# Patient Record
Sex: Female | Born: 1970 | Race: Black or African American | Hispanic: No | Marital: Married | Smoking: Never smoker
Health system: Southern US, Community
[De-identification: ages and names within clinical notes are randomized; demographics above are authoritative.]

## PROBLEM LIST (undated history)

## (undated) DIAGNOSIS — Z789 Other specified health status: Secondary | ICD-10-CM

## (undated) HISTORY — PX: NO PAST SURGERIES: SHX2092

---

## 2008-04-09 ENCOUNTER — Emergency Department: Payer: Self-pay | Admitting: Unknown Physician Specialty

## 2008-05-09 ENCOUNTER — Observation Stay: Payer: Self-pay | Admitting: Obstetrics and Gynecology

## 2008-09-19 ENCOUNTER — Ambulatory Visit: Payer: Self-pay | Admitting: Internal Medicine

## 2009-07-17 ENCOUNTER — Ambulatory Visit: Payer: Self-pay | Admitting: Internal Medicine

## 2009-11-28 ENCOUNTER — Ambulatory Visit: Payer: Self-pay

## 2011-08-25 ENCOUNTER — Encounter: Payer: Self-pay | Admitting: Obstetrics and Gynecology

## 2011-09-25 ENCOUNTER — Inpatient Hospital Stay: Payer: Self-pay

## 2011-09-25 LAB — CBC WITH DIFFERENTIAL/PLATELET
Eosinophil #: 0 10*3/uL (ref 0.0–0.7)
Eosinophil %: 0.4 %
Lymphocyte %: 19 %
MCHC: 34.3 g/dL (ref 32.0–36.0)
MCV: 82 fL (ref 80–100)
Monocyte %: 5.5 %
Neutrophil %: 74.2 %
Platelet: 215 10*3/uL (ref 150–440)
RBC: 3.64 10*6/uL — ABNORMAL LOW (ref 3.80–5.20)
RDW: 19.5 % — ABNORMAL HIGH (ref 11.5–14.5)
WBC: 11.5 10*3/uL — ABNORMAL HIGH (ref 3.6–11.0)

## 2011-11-18 ENCOUNTER — Ambulatory Visit: Payer: Self-pay | Admitting: Obstetrics and Gynecology

## 2011-11-18 LAB — BASIC METABOLIC PANEL
Calcium, Total: 9.1 mg/dL (ref 8.5–10.1)
EGFR (African American): >60
EGFR (Non-African Amer.): >60
Glucose: 87 mg/dL (ref 65–99)
Potassium: 3.9 mmol/L (ref 3.5–5.1)
Sodium: 141 mmol/L (ref 136–145)

## 2011-11-18 LAB — HEMOGLOBIN: HGB: 10.7 g/dL — ABNORMAL LOW (ref 12.0–16.0)

## 2011-11-24 ENCOUNTER — Ambulatory Visit: Payer: Self-pay | Admitting: Obstetrics and Gynecology

## 2012-09-01 ENCOUNTER — Ambulatory Visit: Payer: Self-pay | Admitting: Internal Medicine

## 2014-04-10 IMAGING — US US OB < 14 WKS - NRPT
1 series · 14 of 15 positions shown · non-contrast
Comparison: none

[Series 1: us ob < 14 wks - nrpt · 15 acquisitions, 14 frames shown]
[im 1/15]
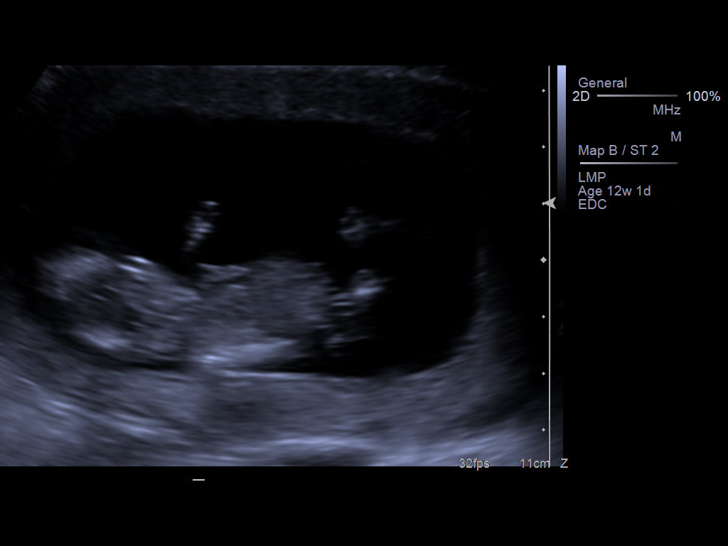
[im 2/15]
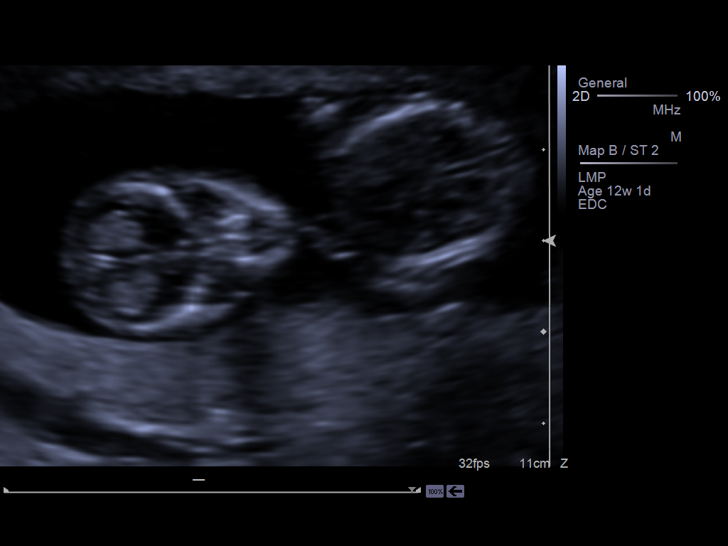
[im 3/15]
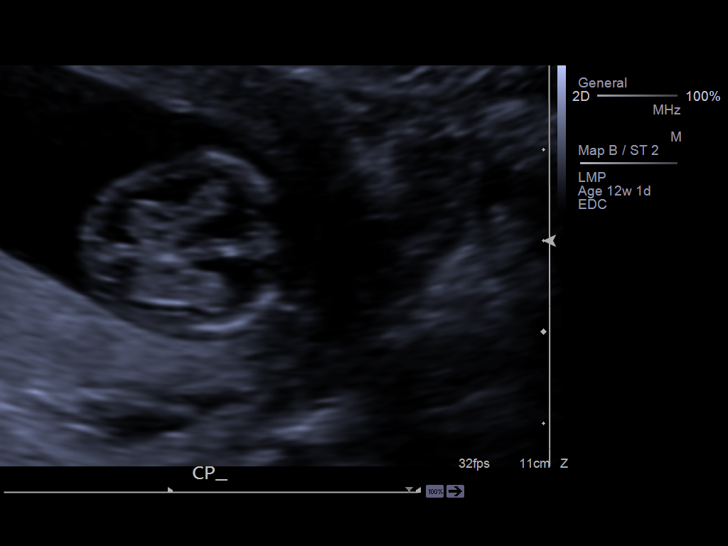
[im 4/15]
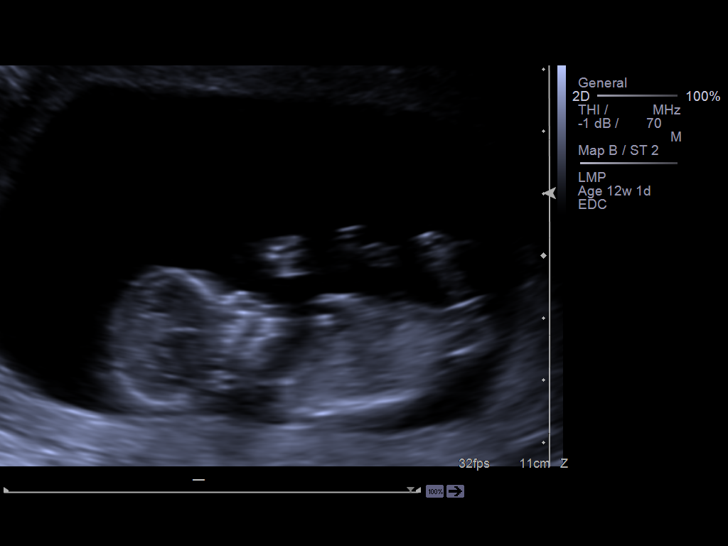
[im 5/15]
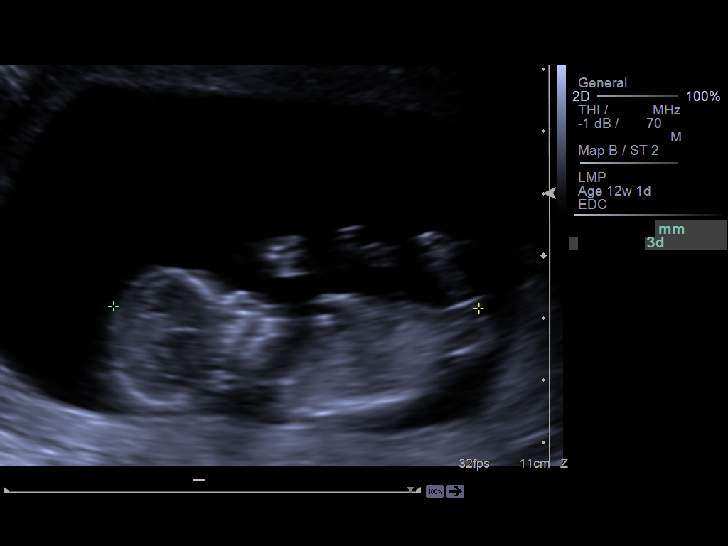
[im 6/15]
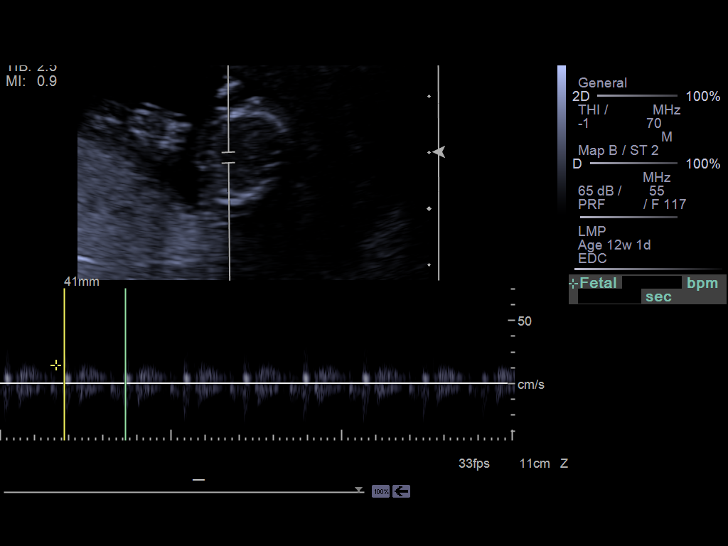
[im 7/15]
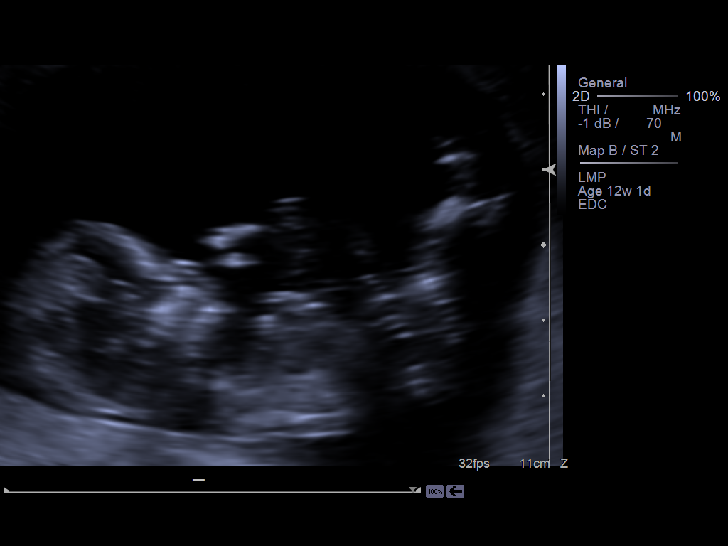
[im 9/15]
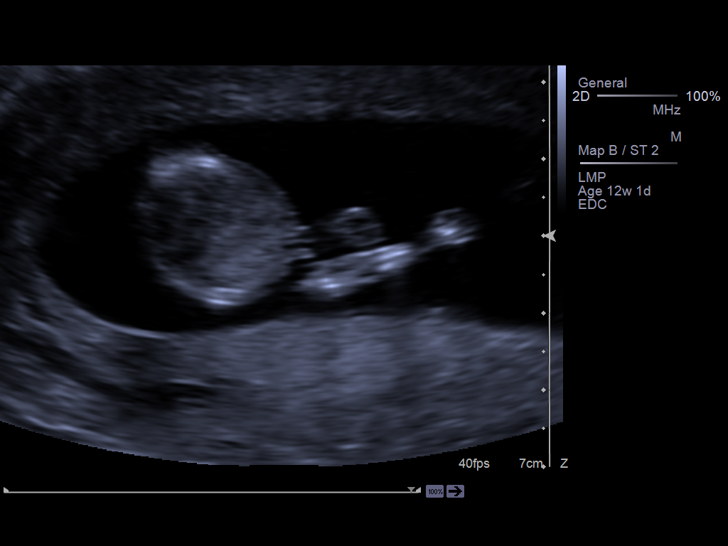
[im 10/15]
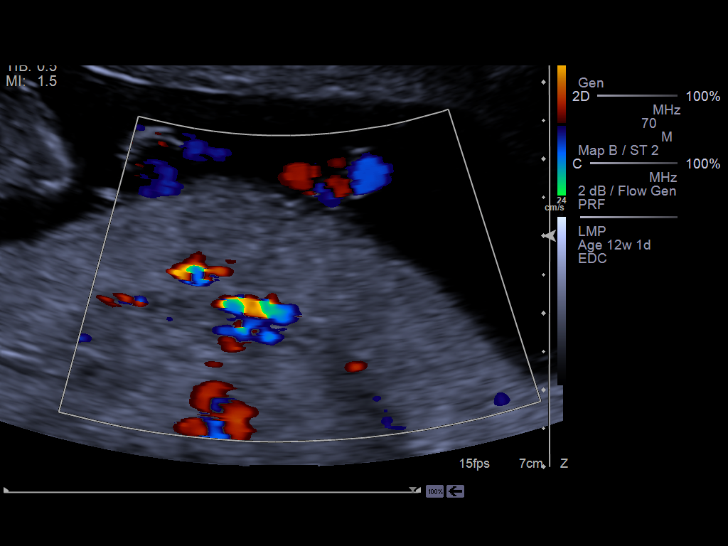
[im 11/15]
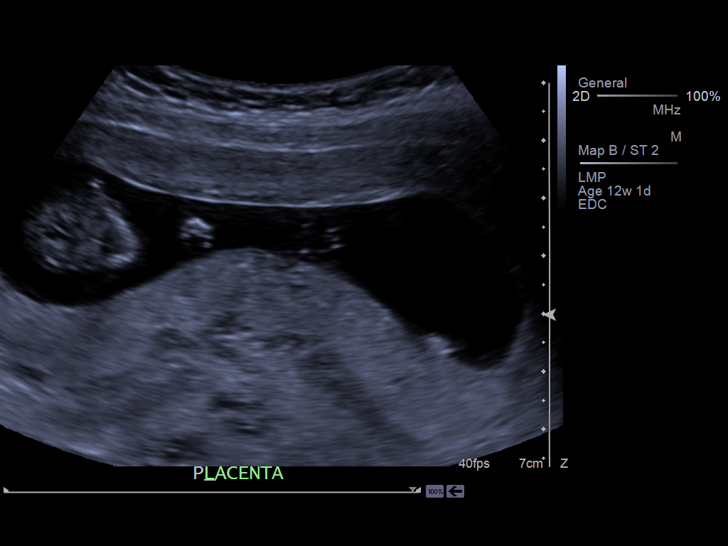
[im 12/15]
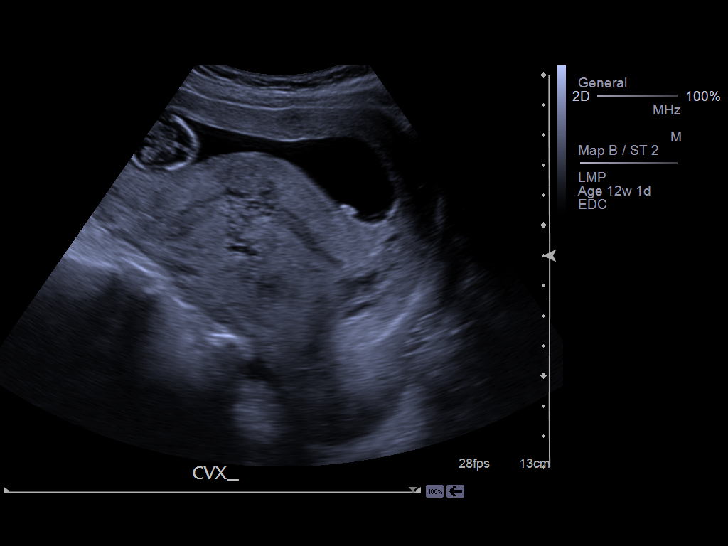
[im 13/15]
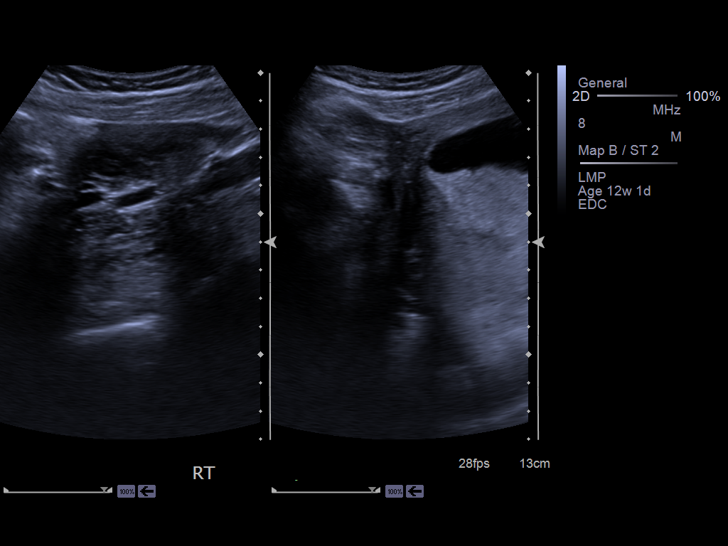
[im 14/15]
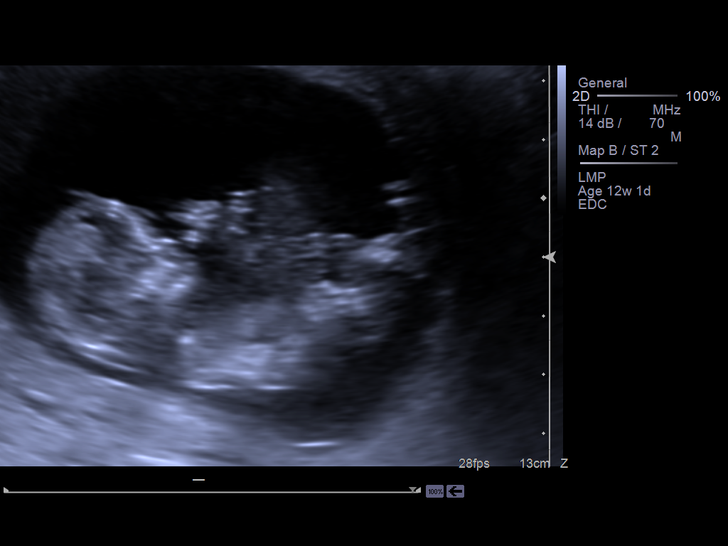
[im 15/15]
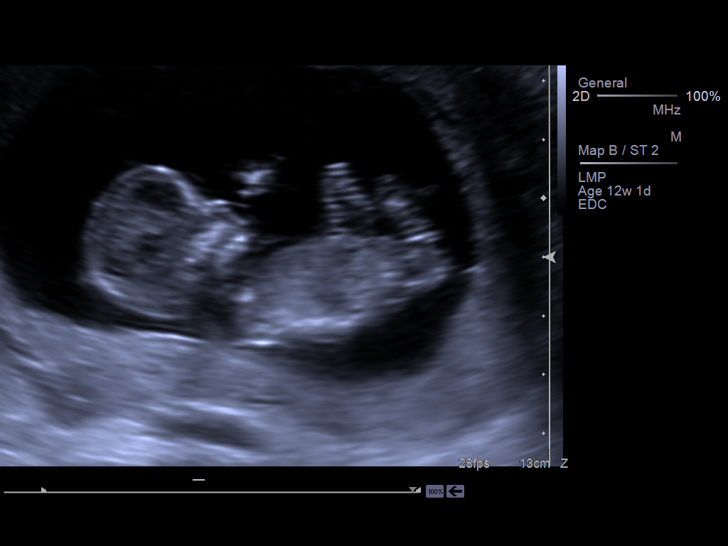

[14 of 15 positions shown; findings below may reference images not displayed]

IMAGES IMPORTED FROM THE [employer] WORKFLOW SYSTEM
NO DICTATION FOR STUDY

## 2014-05-09 NOTE — Discharge Summary (Signed)
PATIENT NAME:  LAKOTA, SCHWEPPE MR#:  [id number] DATE OF BIRTH:  [birth year]  DATE OF ADMISSION:  09/25/2011 DATE OF DISCHARGE:  09/26/2011  HOSPITAL COURSE: This is a 44 year old gravida 3, para 1 patient with missed abortion at 86 weeks. Ultrasound evidence shows fetus expired at 13 weeks. The patient came in for Cytotec induction. The patient after the second 200-mcg Cytotec vaginal medication spontaneously delivered grossly morphologically normal fetus. Placenta delivered shortly thereafter.   On postoperative day #1 the patient was doing well. Vital signs stable. Hematocrit 24.9%. She is discharged to home in good condition. The patient will be discharged to home on prenatal vitamins and iron and it was recommended that she abstain from intercourse for 30 days. We will talk about contraception thereafter. Chromosome studies performed on the abortus.    ____________________________ Boykin Nearing, MD tjs:bjt D: 09/26/2011 09:31:29 ET T: 09/29/2011 11:02:35 ET JOB#: [id number]  cc: Boykin Nearing, MD, <Dictator> Boykin Nearing MD ELECTRONICALLY SIGNED 09/30/2011 19:36

## 2014-05-09 NOTE — Op Note (Signed)
PATIENT NAME:  Linda Frazier, ROUND MR#:  [id number] DATE OF BIRTH:  Oct 15, 1970  DATE OF PROCEDURE:  11/24/2011  PREOPERATIVE DIAGNOSIS:  1. Endometrial polyp. 2. Submucosal fibroid.   POSTOPERATIVE DIAGNOSIS: Endometrial polyp.   PROCEDURES:  1. Fractional dilation and curettage.  2. Hysteroscopy.   SURGEON: Boykin Nearing, M.D.   ANESTHESIA: General endotracheal anesthesia.   INDICATION: A 43 year old gravida 3, para 1, a patient with a 16 week fetal loss. Saline infusion sonohysterography in the office showed endometrial polyp and a 1 cm submucosal fibroid.   DESCRIPTION OF PROCEDURE: After adequate general endotracheal anesthesia, the patient was placed in the dorsal supine position with legs in the candy-cane stirrups. The patient's abdomen, vagina, and perineum were prepped in normal standard fashion. Straight catheterization of the bladder yielded 100 mL of clear urine. Weighted speculum was placed in the posterior vaginal vault and the anterior cervix was grasped with a single-tooth tenaculum. An endocervical curettage was performed followed by sounding of the uterus. The uterus sounded to 8 cm. The uterus was in the retroverted position. The cervix was then dilated to a #18 Hanks dilator without difficulty. Hysteroscope was advanced into the endometrial cavity. Extremely fluffy endometrium was noted, possible polyp at the fundal area. The right ostia was identified, left ostia was not. Hysteroscope was removed. Of note, lactated Ringer's was used as distending medium. Endometrial curettage was performed with a large amount of tissue and possible polyp removed. Repeat hysteroscopic evaluation failed to demonstrate a definitive submucosal fibroid that was identified in the office procedure. Therefore, the procedure was terminated. The single-    tooth tenaculum was removed and silver nitrate used to control oozing from the tenacula sites. There were no complications. Estimated blood loss  minimal. Intraoperative fluids 500 mL. Urine output 100 mL. The patient was taken to the recovery room in good condition.  ____________________________ Boykin Nearing, MD tjs:slb D: 11/24/2011 10:01:02 ET T: 11/24/2011 10:31:57 ET JOB#: [id number]  cc: Boykin Nearing, MD, <Dictator> Boykin Nearing MD ELECTRONICALLY SIGNED 11/27/2011 9:03

## 2014-05-30 NOTE — H&P (Signed)
L&D Evaluation:  History:   HPI 44 yo G3P1 at 16 weeks with Missed abortion documented on u/s today . - FHM. small amt of spotting    Patient's Medical History No Chronic Illness    Patient's Surgical History none    Medications Pre Natal Vitamins    Allergies NKDA    Social History none    Family History Non-Contributory   ROS:   ROS All systems were reviewed.  HEENT, CNS, GI, GU, Respiratory, CV, Renal and Musculoskeletal systems were found to be normal.   Exam:   Vital Signs stable    General no apparent distress    Mental Status clear    Chest clear    Heart normal sinus rhythm    Abdomen gravid, non-tender, 16 week FH    Edema no edema    Reflexes 1+    Pelvic closed 40 %effaced    FHT none   Impression:   Impression 16 week Missed Abortion   Plan:   Plan cytotec  200 mcg induction . Pt wishes to have genetic studies on placental tissue   Electronic Signatures: Linda Frazier, Gwen Her (MD)  (Signed 05-Sep-13 14:50)  Authored: L&D Evaluation   Last Updated: 05-Sep-13 14:50 by Boykin Nearing (MD)

## 2015-04-18 IMAGING — CR DG CHEST 2V
1 series · 2 of 2 positions shown · non-contrast
Comparison: none

REASON FOR EXAM: chest pain Shortness of breath
COMMENTS:

PROCEDURE:     DXR - DXR CHEST PA (OR AP) AND LATERAL  - September 01, 2012  [DATE]
RESULT:     Comparison: None

[Series 1: w chest pa · 0.14mm/px · 2 of 2 slices shown]
[im 1/2]
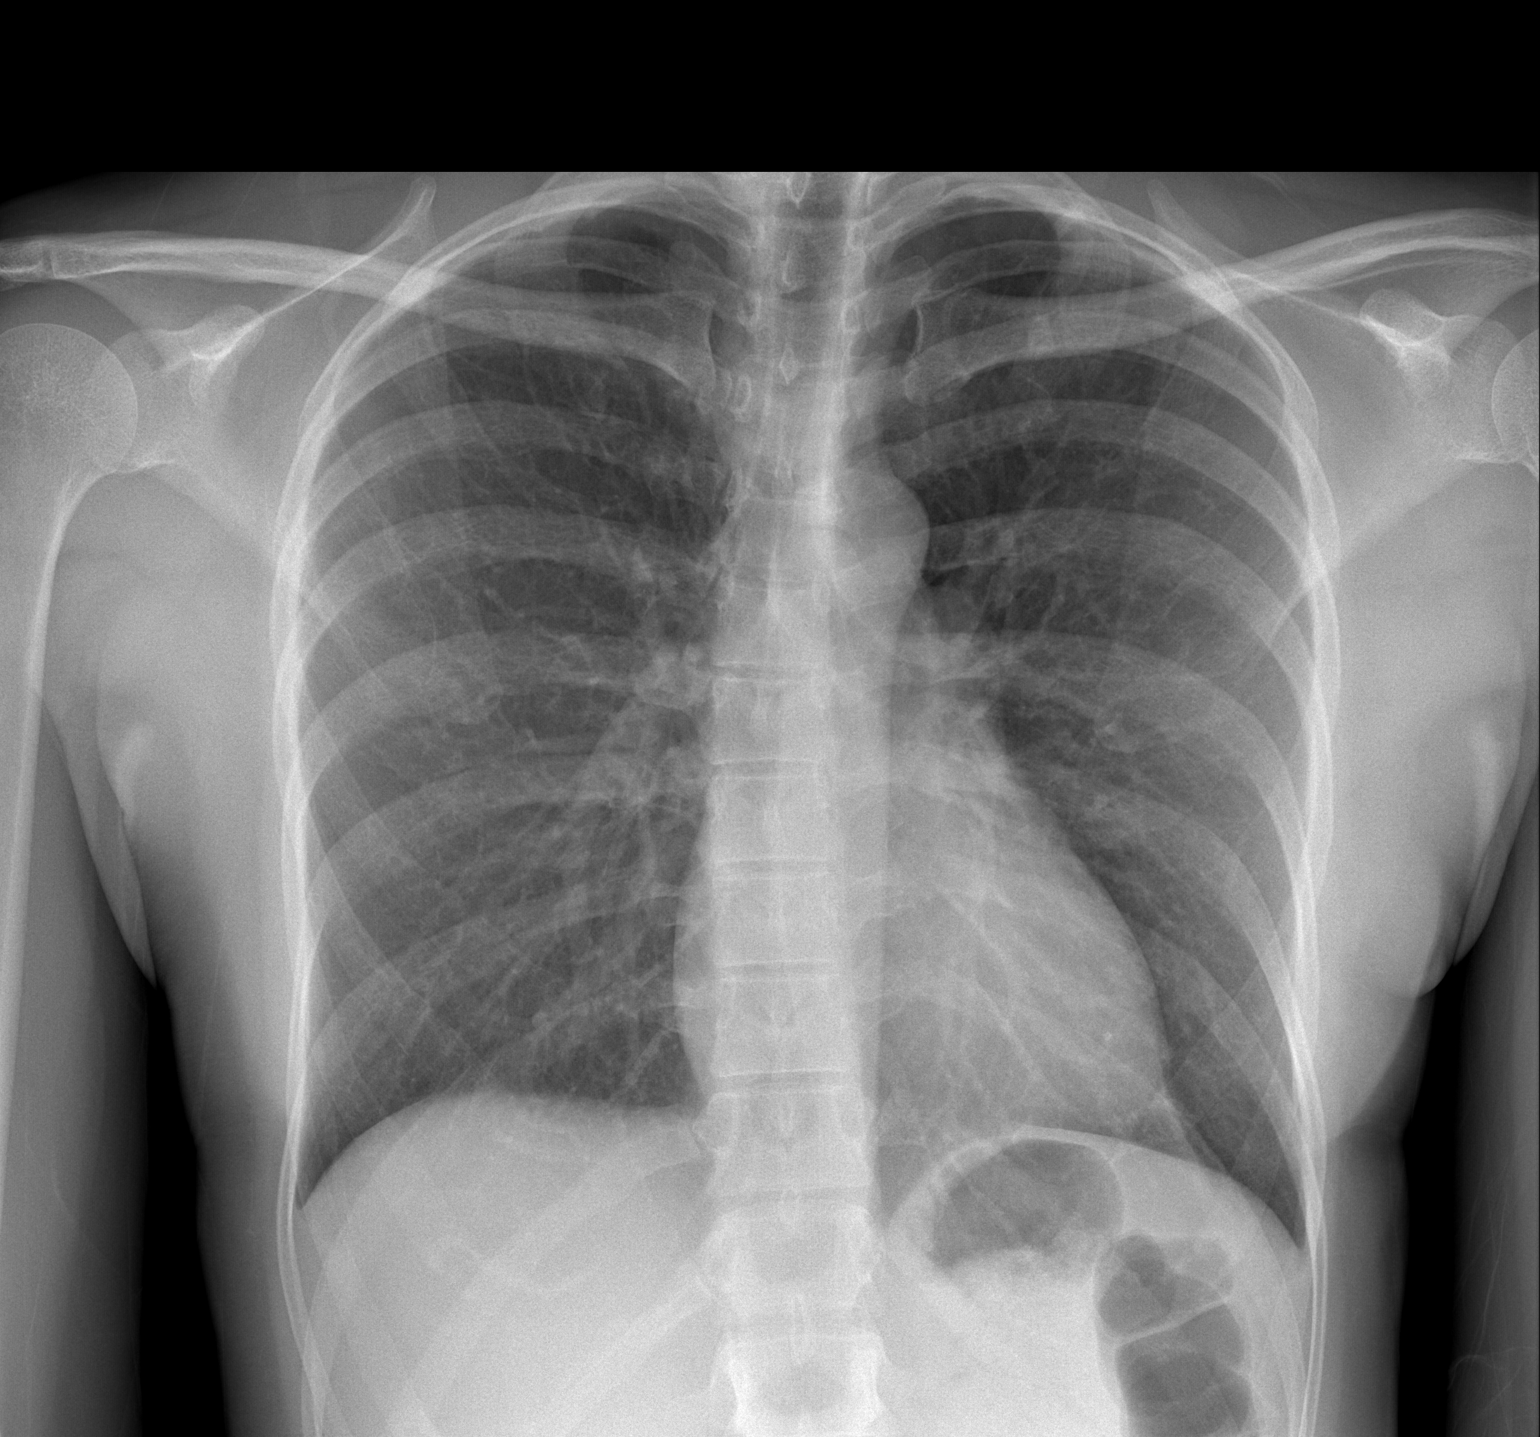
[im 2/2]
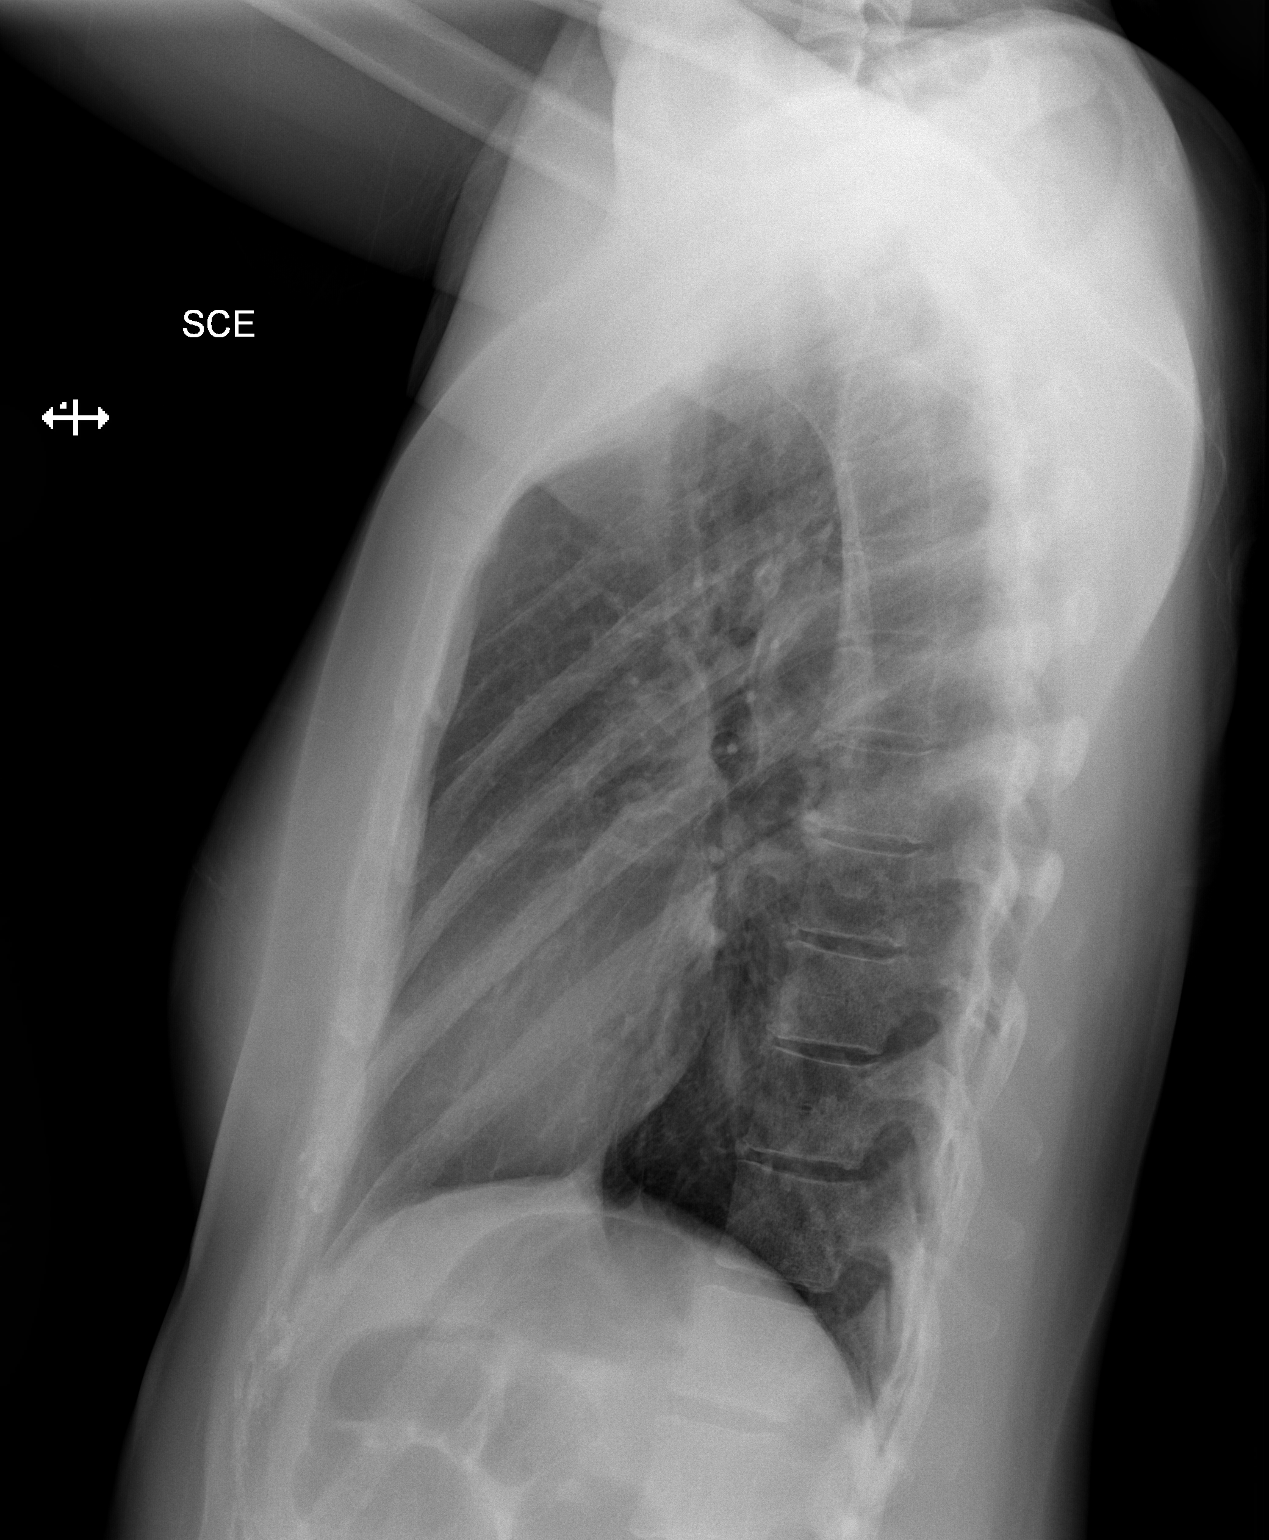

[2 of 2 positions shown; findings below may reference images not displayed]

FINDINGS: PA and lateral chest radiographs are provided.  There is no focal
parenchymal opacity, pleural effusion, or pneumothorax. The heart and
mediastinum are unremarkable.  The osseous structures are unremarkable.
IMPRESSION: No acute disease of the che[REDACTED]

## 2015-07-05 ENCOUNTER — Other Ambulatory Visit: Payer: Self-pay | Admitting: Obstetrics and Gynecology

## 2015-07-18 ENCOUNTER — Ambulatory Visit: Payer: Self-pay | Attending: Internal Medicine

## 2015-07-18 ENCOUNTER — Ambulatory Visit
Admission: RE | Admit: 2015-07-18 | Discharge: 2015-07-18 | Disposition: A | Discharge status: Home / Self Care | Payer: Self-pay | Source: Ambulatory Visit | Attending: Internal Medicine | Admitting: Internal Medicine

## 2015-07-18 VITALS — BP 149/93 | HR 82 | Temp 98.1°F | Resp 16 | Ht 61.42 in | Wt 108.0 lb

## 2015-07-18 DIAGNOSIS — Z0000 Encounter for general adult medical examination without abnormal findings: Secondary | ICD-10-CM

## 2015-07-18 NOTE — Progress Notes (Signed)
Subjective:     Patient ID: Linda Frazier, female   DOB: January 07, 1971, 45 y.o.   MRN: [id number]  HPI   Review of Systems     Objective:   Physical Exam  Pulmonary/Chest: Right breast exhibits no inverted nipple, no mass, no nipple discharge, no skin change and no tenderness. Left breast exhibits no inverted nipple, no mass, no nipple discharge, no skin change and no tenderness. Breasts are symmetrical.       Assessment:     45 year old female  patient presents for [facility] clinic visit.  Patient screened, and meets BCCCP eligibility.  Patient does not have insurance, Medicare or Medicaid.  Handout given on Affordable Care Act.  Instructed patient on breast self-exam using teach back method.  CBE unremarkable.  No mass or lump palpated.  Patient had baseline mammogram in 2011.  Works as Engineer, manufacturing at [employer].    Plan:     Sent for bilateral screening mammogram.

## 2015-07-20 ENCOUNTER — Encounter: Payer: Self-pay | Admitting: *Deleted

## 2015-07-20 DIAGNOSIS — N63 Unspecified lump in unspecified breast: Secondary | ICD-10-CM

## 2015-07-31 ENCOUNTER — Ambulatory Visit: Payer: Self-pay

## 2015-08-09 ENCOUNTER — Ambulatory Visit
Admission: RE | Admit: 2015-08-09 | Discharge: 2015-08-09 | Disposition: A | Discharge status: Home / Self Care | Payer: Self-pay | Source: Ambulatory Visit | Attending: Internal Medicine | Admitting: Internal Medicine

## 2015-08-09 DIAGNOSIS — N63 Unspecified lump in unspecified breast: Secondary | ICD-10-CM

## 2015-08-16 ENCOUNTER — Other Ambulatory Visit: Payer: Self-pay | Admitting: Obstetrics and Gynecology

## 2015-08-16 DIAGNOSIS — N6310 Unspecified lump in the right breast, unspecified quadrant: Secondary | ICD-10-CM

## 2015-09-21 ENCOUNTER — Other Ambulatory Visit: Payer: Self-pay

## 2015-09-21 DIAGNOSIS — N63 Unspecified lump in unspecified breast: Secondary | ICD-10-CM

## 2015-10-03 NOTE — Progress Notes (Signed)
Patient had Birads 3 results on 08/09/15.  Mailed 6 month follow-up mammogram appointment reminder for 02/13/16, after phoning patient with results,and appointment.  Copy to [facility].

## 2016-02-13 ENCOUNTER — Ambulatory Visit: Payer: Self-pay | Attending: Obstetrics and Gynecology

## 2016-07-15 NOTE — H&P (Signed)
Ms. Quinn is a 46 y.o. female here for Colposcopy and Discuss surgery . Marland Kitchen Pt with menorrhagia and dysmenorrhea ,. Pt with a previous Fx D+X 2013 No polyps found .  She does c/o of worsening menorrhagia With bleeding irregular and ++ heavy 5-8 days with large clots . No dyspareunia . + dysmenorrhea  S/p by me 4 years ago with fx d+c and h/s .  Pt uses multiple pads per cycle and wants to move on with more definitive options   Saline infusion sono on 05/27/16 shows SIS :  : 1.9 X0.6 cm endometrial polyp noted  4 fibroids noted 3.0x2.4x2.6 cm UTX 9 cm  embx 07/06/16 - neg Pap 07/06/16 : LGSIL and + HR HPV  Recent pap shows LGSIL  And + HR HPV  Colposcopic eval of the cx today . 2 biopsies taken  Past Medical History:  has no past medical history on file.  Past Surgical History:  has a past surgical history that includes Fx D&C with Hysteroscopy (2013); Dilation and curettage of uterus (2010); and Colposcopy. Family History: family history includes Colon cancer in her maternal grandmother. Social History:  reports that she has never smoked. She has never used smokeless tobacco. She reports that she does not drink alcohol. OB/GYN History:          OB History    Gravida Para Term Preterm AB Living   3 1 1   2 1    SAB TAB Ectopic Molar Multiple Live Births   2                Allergies: has No Known Allergies. Medications: No current outpatient prescriptions on file.  Review of Systems: General:                      No fatigue or weight loss Eyes:                           No vision changes Ears:                            No hearing difficulty Respiratory:                No cough or shortness of breath Pulmonary:                  No asthma or shortness of breath Cardiovascular:           No chest pain, palpitations, dyspnea on exertion Gastrointestinal:          No abdominal bloating, chronic diarrhea, constipations, masses, pain or hematochezia Genitourinary:              No hematuria, dysuria, abnormal vaginal discharge, pelvic pain,+ Menometrorrhagia, Cx dysplasia, + dysmenorrhea  Lymphatic:                   No swollen lymph nodes Musculoskeletal:         No muscle weakness Neurologic:                  No extremity weakness, syncope, seizure disorder Psychiatric:                  No history of depression, delusions or suicidal/homicidal ideation    Exam:      Vitals:   06/26/16 1517  BP: 159/81  Pulse: 87    Body  mass index is 22.3 kg/m.  WDWN white/ black female in NAD   Lungs: CTA  CV : RRR without murmur   Breast: exam done in sitting and lying position : No dimpling or retraction, no dominant mass, no spontaneous discharge, no axillary adenopathy Neck:  no thyromegaly Abdomen: soft , no mass, normal active bowel sounds,  non-tender, no rebound tenderness Pelvic: tanner stage 5 ,  External genitalia: vulva /labia no lesions Urethra: no prolapse Vagina: normal physiologic d/c Cervix: no lesions, no cervical motion tenderness  , anterior cx   Adequate room for TVH  Uterus: normal size shape and contour, non-tender, retroverted  Adnexa: no mass,  non-tender   Rectovaginal:  Colposcopic exam: A speculum is placed and 5% acetic acid is applied to the cervix. There is acetowhite epithelium at 12+6o"clock. There is no puncation .  There is no  mosacism . Representative biopsies are done at 6+12 oclock. Chaya Jan solution is applied with good hemostasis. Impression:   The primary encounter diagnosis was Pap smear abnormality of cervix with LGSIL. Diagnoses of Menorrhagia with regular cycle, Endometrium, polyp, Cervical dysplasia, Fibroids, intramural, and Dyspareunia, female were also pertinent to this visit.    Plan:   I have spoken with the patient regarding treatment options including expectant management, hormonal options, or surgical intervention. After a full discussion the pt elects to proceed with TVH and bilateral  salpingectomy   Benefits and risks to surgery: TVh and Bilateral salpingectomy  The proposed benefit of the surgery has been discussed with the patient. The possible risks include, but are not limited to: organ injury to the bowel , bladder, ureters, and major blood vessels and nerves. There is a possibility of additional surgeries resulting from these injuries. There is also the risk of blood transfusion and the need to receive blood products during or after the procedure which may rarely lead to HIV or Hepatitis C infection. There is a risk of developing a deep venous thrombosis or a pulmonary embolism . There is the possibility of wound infection and also anesthetic complications, even the rare possibility of death. The patient understands these risks and wishes to proceed. All questions have been answered and the consent has been signed.      Orders Placed This Encounter  Procedures  . Pathology Report - [facility]    1. Cervical Biopsy at 6:00 position 2. Cervical Biopsy at 12:00 position    Return if symptoms worsen or fail to improve, for preop.  Caroline Sauger, MD

## 2016-07-17 ENCOUNTER — Encounter
Admission: RE | Admit: 2016-07-17 | Discharge: 2016-07-17 | Disposition: A | Discharge status: Home / Self Care | Payer: Managed Care, Other (non HMO) | Source: Ambulatory Visit | Attending: Obstetrics and Gynecology | Admitting: Obstetrics and Gynecology

## 2016-07-17 DIAGNOSIS — D259 Leiomyoma of uterus, unspecified: Secondary | ICD-10-CM | POA: Insufficient documentation

## 2016-07-17 DIAGNOSIS — N920 Excessive and frequent menstruation with regular cycle: Secondary | ICD-10-CM | POA: Insufficient documentation

## 2016-07-17 DIAGNOSIS — N840 Polyp of corpus uteri: Secondary | ICD-10-CM | POA: Insufficient documentation

## 2016-07-17 DIAGNOSIS — Z01818 Encounter for other preprocedural examination: Secondary | ICD-10-CM | POA: Insufficient documentation

## 2016-07-17 HISTORY — DX: Other specified health status: Z78.9

## 2016-07-17 NOTE — Patient Instructions (Signed)
  Your procedure is scheduled on: 07/21/16 Report to Same Day Surgery 2nd floor medical mall [facility] Entrance-take elevator on left to 2nd floor.  Check in with surgery information desk.) To find out your arrival time please call [phone] between 1PM - 3PM on 07/18/16  Remember: Instructions that are not followed completely may result in serious medical risk, up to and including death, or upon the discretion of your surgeon and anesthesiologist your surgery may need to be rescheduled.    _x___ 1. Do not eat food or drink liquids after midnight. No gum chewing or                              hard candies.     ____ 2. No Alcohol for 24 hours before or after surgery.   ____3. No Smoking for 24 prior to surgery.   ____  4. Bring all medications with you on the day of surgery if instructed.    __x__ 5. Notify your doctor if there is any change in your medical condition     (cold, fever, infections).     Do not wear jewelry, make-up, hairpins, clips or nail polish.  Do not wear lotions, powders, or perfumes. You may wear deodorant.  Do not shave 48 hours prior to surgery. Men may shave face and neck.  Do not bring valuables to the hospital.    [facility] is not responsible for any belongings or valuables.               Contacts, dentures or bridgework may not be worn into surgery.  Leave your suitcase in the car. After surgery it may be brought to your room.  For patients admitted to the hospital, discharge time is determined by your                       treatment team.   Patients discharged the day of surgery will not be allowed to drive home.  You will need someone to drive you home and stay with you the night of your procedure.    Please read over the following fact sheets that you were given:   [facility] Preparing for Surgery   ____ Take anti-hypertensive (unless it includes a diuretic), cardiac, seizure, asthma,     anti-reflux and psychiatric medicines. These  include:  1. NONE  2.  3.  4.  5.  6.  ____Fleets enema or Magnesium Citrate as directed.   ____ Use CHG Soap or sage wipes as directed on instruction sheet   ____ Use inhalers on the day of surgery and bring to hospital day of surgery  ____ Stop Metformin and Janumet 2 days prior to surgery.    ____ Take 1/2 of usual insulin dose the night before surgery and none on the morning     surgery.   ____ Follow recommendations from Cardiologist, Pulmonologist or PCP regarding  stopping Aspirin, Coumadin, Pllavix ,Eliquis, Effient, or Pradaxa, and Pletal.  X____Stop Anti-inflammatories such as Advil, Aleve, Ibuprofen, Motrin, Naproxen, Naprosyn, Goodies powders or aspirin products. OK to take Tylenol andCelebrex.   _x___ Stop supplements until after surgery.  But may continue Vitamin D, Vitamin B,   and multivitamin.   ____ Bring C-Pap to the hospital.   [place]

## 2016-07-18 ENCOUNTER — Encounter
Admission: RE | Admit: 2016-07-18 | Discharge: 2016-07-18 | Disposition: A | Discharge status: Home / Self Care | Payer: Managed Care, Other (non HMO) | Source: Ambulatory Visit | Attending: Obstetrics and Gynecology | Admitting: Obstetrics and Gynecology

## 2016-07-18 DIAGNOSIS — D259 Leiomyoma of uterus, unspecified: Secondary | ICD-10-CM | POA: Diagnosis not present

## 2016-07-18 DIAGNOSIS — N840 Polyp of corpus uteri: Secondary | ICD-10-CM | POA: Diagnosis not present

## 2016-07-18 DIAGNOSIS — N920 Excessive and frequent menstruation with regular cycle: Secondary | ICD-10-CM | POA: Diagnosis not present

## 2016-07-18 DIAGNOSIS — Z01818 Encounter for other preprocedural examination: Secondary | ICD-10-CM | POA: Diagnosis not present

## 2016-07-18 LAB — BASIC METABOLIC PANEL
ANION GAP: 7 (ref 5–15)
BUN: 13 mg/dL (ref 6–20)
CALCIUM: 9.8 mg/dL (ref 8.9–10.3)
CO2: 25 mmol/L (ref 22–32)
Chloride: 105 mmol/L (ref 101–111)
Creatinine, Ser: 0.78 mg/dL (ref 0.44–1.00)
Glucose, Bld: 88 mg/dL (ref 65–99)
Potassium: 3.8 mmol/L (ref 3.5–5.1)
SODIUM: 137 mmol/L (ref 135–145)

## 2016-07-18 LAB — CBC
HCT: 26.8 % — ABNORMAL LOW (ref 35.0–47.0)
HEMOGLOBIN: 8.2 g/dL — AB (ref 12.0–16.0)
MCH: 20.1 pg — AB (ref 26.0–34.0)
MCHC: 30.7 g/dL — ABNORMAL LOW (ref 32.0–36.0)
MCV: 65.3 fL — ABNORMAL LOW (ref 80.0–100.0)
PLATELETS: 353 10*3/uL (ref 150–440)
RBC: 4.1 MIL/uL (ref 3.80–5.20)
RDW: 19 % — ABNORMAL HIGH (ref 11.5–14.5)
WBC: 8.7 10*3/uL (ref 3.6–11.0)

## 2016-07-18 LAB — TYPE AND SCREEN
ABO/RH(D): B POS
ANTIBODY SCREEN: NEGATIVE

## 2016-07-21 ENCOUNTER — Ambulatory Visit: Payer: Managed Care, Other (non HMO) | Admitting: Anesthesiology

## 2016-07-21 ENCOUNTER — Encounter: Payer: Self-pay | Admitting: *Deleted

## 2016-07-21 ENCOUNTER — Observation Stay
Admission: RE | Admit: 2016-07-21 | Discharge: 2016-07-22 | Disposition: A | Discharge status: Home / Self Care | Payer: Managed Care, Other (non HMO) | Source: Ambulatory Visit | Attending: Obstetrics and Gynecology | Admitting: Obstetrics and Gynecology

## 2016-07-21 ENCOUNTER — Encounter
Admission: RE | Disposition: A | Discharge status: Home / Self Care | Payer: Self-pay | Source: Ambulatory Visit | Attending: Obstetrics and Gynecology

## 2016-07-21 DIAGNOSIS — N802 Endometriosis of fallopian tube: Secondary | ICD-10-CM | POA: Diagnosis not present

## 2016-07-21 DIAGNOSIS — Z9889 Other specified postprocedural states: Secondary | ICD-10-CM

## 2016-07-21 DIAGNOSIS — N920 Excessive and frequent menstruation with regular cycle: Secondary | ICD-10-CM | POA: Insufficient documentation

## 2016-07-21 DIAGNOSIS — Z800 Family history of malignant neoplasm of digestive organs: Secondary | ICD-10-CM | POA: Insufficient documentation

## 2016-07-21 DIAGNOSIS — D251 Intramural leiomyoma of uterus: Secondary | ICD-10-CM | POA: Diagnosis not present

## 2016-07-21 DIAGNOSIS — N840 Polyp of corpus uteri: Secondary | ICD-10-CM | POA: Insufficient documentation

## 2016-07-21 HISTORY — PX: VAGINAL HYSTERECTOMY: SHX2639

## 2016-07-21 HISTORY — PX: BILATERAL SALPINGECTOMY: SHX5743

## 2016-07-21 LAB — POCT PREGNANCY, URINE: Preg Test, Ur: NEGATIVE

## 2016-07-21 LAB — ABO/RH: ABO/RH(D): B POS

## 2016-07-21 SURGERY — HYSTERECTOMY, VAGINAL
Anesthesia: General

## 2016-07-21 MED ORDER — LIDOCAINE-EPINEPHRINE 1 %-1:100000 IJ SOLN
INTRAMUSCULAR | Status: AC
  Filled 2016-07-21: qty 1

## 2016-07-21 MED ORDER — FENTANYL CITRATE (PF) 250 MCG/5ML IJ SOLN
INTRAMUSCULAR | Status: AC
  Filled 2016-07-21: qty 5

## 2016-07-21 MED ORDER — LACTATED RINGERS IV SOLN
INTRAVENOUS | Status: DC
  Administered 2016-07-21 – 2016-07-22 (×2): via INTRAVENOUS

## 2016-07-21 MED ORDER — PROPOFOL 10 MG/ML IV BOLUS
INTRAVENOUS | Status: AC
  Filled 2016-07-21: qty 20

## 2016-07-21 MED ORDER — FAMOTIDINE 20 MG PO TABS
ORAL_TABLET | ORAL | Status: AC
  Filled 2016-07-21: qty 1

## 2016-07-21 MED ORDER — LIDOCAINE-EPINEPHRINE 1 %-1:100000 IJ SOLN
INTRAMUSCULAR | Status: DC | PRN
  Administered 2016-07-21: 9 mL

## 2016-07-21 MED ORDER — PHENYLEPHRINE HCL 10 MG/ML IJ SOLN
INTRAMUSCULAR | Status: DC | PRN
  Administered 2016-07-21: 50 ug via INTRAVENOUS

## 2016-07-21 MED ORDER — LACTATED RINGERS IV SOLN
INTRAVENOUS | Status: DC | PRN
  Administered 2016-07-21: 12:00:00 via INTRAVENOUS

## 2016-07-21 MED ORDER — LIDOCAINE HCL (PF) 1 % IJ SOLN
INTRAMUSCULAR | Status: AC
  Filled 2016-07-21: qty 2

## 2016-07-21 MED ORDER — ACETAMINOPHEN 10 MG/ML IV SOLN
INTRAVENOUS | Status: DC | PRN
  Administered 2016-07-21: 1000 mg via INTRAVENOUS

## 2016-07-21 MED ORDER — SCOPOLAMINE 1 MG/3DAYS TD PT72
MEDICATED_PATCH | TRANSDERMAL | Status: AC
  Filled 2016-07-21: qty 1

## 2016-07-21 MED ORDER — ONDANSETRON HCL 4 MG PO TABS: 4.0000 mg | ORAL_TABLET | Freq: Four times a day (QID) | ORAL | Status: DC | PRN

## 2016-07-21 MED ORDER — FENTANYL CITRATE (PF) 100 MCG/2ML IJ SOLN
INTRAMUSCULAR | Status: AC
  Filled 2016-07-21: qty 2

## 2016-07-21 MED ORDER — KETOROLAC TROMETHAMINE 30 MG/ML IJ SOLN
30.0000 mg | Freq: Three times a day (TID) | INTRAMUSCULAR | Status: DC | PRN
  Administered 2016-07-21: 30 mg via INTRAVENOUS
  Filled 2016-07-21: qty 1

## 2016-07-21 MED ORDER — SUCCINYLCHOLINE CHLORIDE 20 MG/ML IJ SOLN
INTRAMUSCULAR | Status: DC | PRN
  Administered 2016-07-21: 60 mg via INTRAVENOUS

## 2016-07-21 MED ORDER — LIDOCAINE HCL (CARDIAC) 20 MG/ML IV SOLN
INTRAVENOUS | Status: DC | PRN
  Administered 2016-07-21: 50 mg via INTRAVENOUS

## 2016-07-21 MED ORDER — FENTANYL CITRATE (PF) 100 MCG/2ML IJ SOLN
INTRAMUSCULAR | Status: AC
  Administered 2016-07-21: 25 ug via INTRAVENOUS
  Filled 2016-07-21: qty 2

## 2016-07-21 MED ORDER — SUGAMMADEX SODIUM 200 MG/2ML IV SOLN
INTRAVENOUS | Status: DC | PRN
  Administered 2016-07-21: 110 mg via INTRAVENOUS

## 2016-07-21 MED ORDER — ROCURONIUM BROMIDE 100 MG/10ML IV SOLN
INTRAVENOUS | Status: DC | PRN
  Administered 2016-07-21: 5 mg via INTRAVENOUS
  Administered 2016-07-21: 45 mg via INTRAVENOUS

## 2016-07-21 MED ORDER — DEXAMETHASONE SODIUM PHOSPHATE 10 MG/ML IJ SOLN
INTRAMUSCULAR | Status: DC | PRN
  Administered 2016-07-21: 4 mg via INTRAVENOUS

## 2016-07-21 MED ORDER — LACTATED RINGERS IV SOLN
INTRAVENOUS | Status: DC
  Administered 2016-07-21: 11:00:00 via INTRAVENOUS

## 2016-07-21 MED ORDER — ACETAMINOPHEN NICU IV SYRINGE 10 MG/ML
INTRAVENOUS | Status: AC
  Filled 2016-07-21: qty 1

## 2016-07-21 MED ORDER — FENTANYL CITRATE (PF) 100 MCG/2ML IJ SOLN
25.0000 ug | INTRAMUSCULAR | Status: AC | PRN
  Administered 2016-07-21 (×6): 25 ug via INTRAVENOUS

## 2016-07-21 MED ORDER — LACTATED RINGERS IV SOLN: INTRAVENOUS | Status: DC

## 2016-07-21 MED ORDER — KETOROLAC TROMETHAMINE 30 MG/ML IJ SOLN
INTRAMUSCULAR | Status: DC | PRN
  Administered 2016-07-21: 30 mg via INTRAVENOUS

## 2016-07-21 MED ORDER — ONDANSETRON HCL 4 MG/2ML IJ SOLN
INTRAMUSCULAR | Status: AC
  Filled 2016-07-21: qty 2

## 2016-07-21 MED ORDER — LIDOCAINE HCL (PF) 2 % IJ SOLN
INTRAMUSCULAR | Status: AC
  Filled 2016-07-21: qty 2

## 2016-07-21 MED ORDER — ONDANSETRON HCL 4 MG/2ML IJ SOLN: 4.0000 mg | Freq: Once | INTRAMUSCULAR | Status: DC | PRN

## 2016-07-21 MED ORDER — ONDANSETRON HCL 4 MG/2ML IJ SOLN
4.0000 mg | Freq: Four times a day (QID) | INTRAMUSCULAR | Status: DC | PRN
Start: 2016-07-21 — End: 2016-07-22

## 2016-07-21 MED ORDER — FENTANYL CITRATE (PF) 100 MCG/2ML IJ SOLN
INTRAMUSCULAR | Status: DC | PRN
  Administered 2016-07-21: 50 ug via INTRAVENOUS
  Administered 2016-07-21: 100 ug via INTRAVENOUS

## 2016-07-21 MED ORDER — MIDAZOLAM HCL 2 MG/2ML IJ SOLN
INTRAMUSCULAR | Status: AC
  Filled 2016-07-21: qty 2

## 2016-07-21 MED ORDER — CEFOXITIN SODIUM-DEXTROSE 2-2.2 GM-% IV SOLR (PREMIX)
2.0000 g | INTRAVENOUS | Status: AC
  Administered 2016-07-21: 2 g via INTRAVENOUS

## 2016-07-21 MED ORDER — ROCURONIUM BROMIDE 50 MG/5ML IV SOLN
INTRAVENOUS | Status: AC
  Filled 2016-07-21: qty 1

## 2016-07-21 MED ORDER — FAMOTIDINE 20 MG PO TABS
20.0000 mg | ORAL_TABLET | Freq: Once | ORAL | Status: AC
  Administered 2016-07-21: 20 mg via ORAL

## 2016-07-21 MED ORDER — FLEET ENEMA 7-19 GM/118ML RE ENEM: 1.0000 | ENEMA | Freq: Once | RECTAL | Status: DC

## 2016-07-21 MED ORDER — HYDROMORPHONE HCL 1 MG/ML IJ SOLN
INTRAMUSCULAR | Status: AC
  Filled 2016-07-21: qty 1

## 2016-07-21 MED ORDER — CEFOXITIN SODIUM-DEXTROSE 2-2.2 GM-% IV SOLR (PREMIX)
INTRAVENOUS | Status: AC
  Filled 2016-07-21: qty 50

## 2016-07-21 MED ORDER — MIDAZOLAM HCL 2 MG/2ML IJ SOLN
INTRAMUSCULAR | Status: DC | PRN
  Administered 2016-07-21: 2 mg via INTRAVENOUS

## 2016-07-21 MED ORDER — LIDOCAINE-EPINEPHRINE 2 %-1:100000 IJ SOLN
INTRAMUSCULAR | Status: AC
  Filled 2016-07-21: qty 1

## 2016-07-21 MED ORDER — PROPOFOL 10 MG/ML IV BOLUS
INTRAVENOUS | Status: DC | PRN
  Administered 2016-07-21: 100 mg via INTRAVENOUS

## 2016-07-21 MED ORDER — SCOPOLAMINE 1 MG/3DAYS TD PT72
1.0000 | MEDICATED_PATCH | TRANSDERMAL | Status: DC
  Administered 2016-07-21: 1.5 mg via TRANSDERMAL

## 2016-07-21 MED ORDER — MORPHINE SULFATE (PF) 2 MG/ML IV SOLN
1.0000 mg | INTRAVENOUS | Status: DC | PRN
  Administered 2016-07-21 (×2): 2 mg via INTRAVENOUS
  Administered 2016-07-21: 1 mg via INTRAVENOUS
  Administered 2016-07-22: 2 mg via INTRAVENOUS
  Filled 2016-07-21 (×4): qty 1

## 2016-07-21 MED ORDER — HYDROMORPHONE HCL 1 MG/ML IJ SOLN
0.5000 mg | INTRAMUSCULAR | Status: DC | PRN
  Administered 2016-07-21: 0.5 mg via INTRAVENOUS

## 2016-07-21 MED ORDER — SUGAMMADEX SODIUM 200 MG/2ML IV SOLN
INTRAVENOUS | Status: AC
  Filled 2016-07-21: qty 2

## 2016-07-21 MED ORDER — OXYCODONE-ACETAMINOPHEN 5-325 MG PO TABS
1.0000 | ORAL_TABLET | ORAL | Status: DC | PRN
  Administered 2016-07-21: 2 via ORAL
  Administered 2016-07-22: 1 via ORAL
  Filled 2016-07-21: qty 1
  Filled 2016-07-21: qty 2

## 2016-07-21 SURGICAL SUPPLY — 34 items
BAG URO DRAIN 2000ML W/SPOUT (MISCELLANEOUS) ×3 IMPLANT
CANISTER SUCT 1200ML W/VALVE (MISCELLANEOUS) ×3 IMPLANT
CATH FOLEY 2WAY  5CC 16FR (CATHETERS) ×1
CATH ROBINSON RED A/P 16FR (CATHETERS) ×3 IMPLANT
CATH URTH 16FR FL 2W BLN LF (CATHETERS) ×2 IMPLANT
DRAPE PERI LITHO V/GYN (MISCELLANEOUS) ×3 IMPLANT
DRAPE SURG 17X11 SM STRL (DRAPES) ×3 IMPLANT
DRAPE UNDER BUTTOCK W/FLU (DRAPES) ×3 IMPLANT
ELECT REM PT RETURN 9FT ADLT (ELECTROSURGICAL) ×3
ELECTRODE REM PT RTRN 9FT ADLT (ELECTROSURGICAL) ×2 IMPLANT
GLOVE BIO SURGEON STRL SZ8 (GLOVE) ×3 IMPLANT
GLOVE BIOGEL PI IND STRL 7.5 (GLOVE) ×2 IMPLANT
GLOVE BIOGEL PI INDICATOR 7.5 (GLOVE) ×1
GLOVE SKINSENSE NS SZ7.0 (GLOVE) ×2
GLOVE SKINSENSE STRL SZ7.0 (GLOVE) ×4 IMPLANT
GOWN STRL REUS W/ TWL LRG LVL3 (GOWN DISPOSABLE) ×8 IMPLANT
GOWN STRL REUS W/ TWL XL LVL3 (GOWN DISPOSABLE) ×2 IMPLANT
GOWN STRL REUS W/TWL LRG LVL3 (GOWN DISPOSABLE) ×4
GOWN STRL REUS W/TWL XL LVL3 (GOWN DISPOSABLE) ×1
KIT RM TURNOVER CYSTO AR (KITS) ×3 IMPLANT
LABEL OR SOLS (LABEL) ×3 IMPLANT
NDL SAFETY 22GX1.5 (NEEDLE) ×3 IMPLANT
PACK BASIN MINOR ARMC (MISCELLANEOUS) ×3 IMPLANT
PAD OB MATERNITY 4.3X12.25 (PERSONAL CARE ITEMS) ×3 IMPLANT
PAD PREP 24X41 OB/GYN DISP (PERSONAL CARE ITEMS) ×3 IMPLANT
SUT PDS 2-0 27IN (SUTURE) ×3 IMPLANT
SUT VIC AB 0 CT1 27 (SUTURE) ×4
SUT VIC AB 0 CT1 27XCR 8 STRN (SUTURE) ×8 IMPLANT
SUT VIC AB 0 CT1 36 (SUTURE) ×6 IMPLANT
SUT VIC AB 2-0 SH 27 (SUTURE) ×3
SUT VIC AB 2-0 SH 27XBRD (SUTURE) ×6 IMPLANT
SYR CONTROL 10ML (SYRINGE) ×3 IMPLANT
SYRINGE 10CC LL (SYRINGE) ×3 IMPLANT
WATER STERILE IRR 1000ML POUR (IV SOLUTION) ×3 IMPLANT

## 2016-07-21 NOTE — Transfer of Care (Signed)
Immediate Anesthesia Transfer of Care Note  Patient: Linda Frazier  Procedure(s) Performed: Procedure(s): HYSTERECTOMY VAGINAL (N/A) BILATERAL SALPINGECTOMY (Bilateral)  Patient Location: PACU  Anesthesia Type:General  Level of Consciousness: sedated and responds to stimulation  Airway & Oxygen Therapy: Patient Spontanous Breathing and Patient connected to face mask oxygen  Post-op Assessment: Report given to RN and Post -op Vital signs reviewed and stable  Post vital signs: Reviewed and stable  Last Vitals:  Vitals:   07/21/16 1013 07/21/16 1308  BP: (!) 151/83 122/77  Pulse: 87 60  Resp: 20 14  Temp: 36.8 C     Last Pain:  Vitals:   07/21/16 1013  TempSrc: Oral         Complications: No apparent anesthesia complications

## 2016-07-21 NOTE — Anesthesia Post-op Follow-up Note (Cosign Needed)
Anesthesia QCDR form completed.        

## 2016-07-21 NOTE — Anesthesia Preprocedure Evaluation (Addendum)
Anesthesia Evaluation  Patient identified by MRN, date of birth, ID band Patient awake    Reviewed: Allergy & Precautions, NPO status , Patient's Chart, lab work & pertinent test results  Airway Mallampati: II  TM Distance: >3 FB     Dental no notable dental hx. (+) Caps   Pulmonary neg pulmonary ROS,    Pulmonary exam normal        Cardiovascular negative cardio ROS Normal cardiovascular exam     Neuro/Psych negative neurological ROS  negative psych ROS   GI/Hepatic negative GI ROS, Neg liver ROS,   Endo/Other  negative endocrine ROS  Renal/GU negative Renal ROS  Female GU complaint     Musculoskeletal negative musculoskeletal ROS (+)   Abdominal Normal abdominal exam  (+)   Peds negative pediatric ROS (+)  Hematology negative hematology ROS (+)   Anesthesia Other Findings   Reproductive/Obstetrics                            Anesthesia Physical Anesthesia Plan  ASA: I  Anesthesia Plan: General   Post-op Pain Management:    Induction: Intravenous  PONV Risk Score and Plan: 4 or greater and Ondansetron, Dexamethasone, Propofol, Midazolam, Scopolamine patch - Pre-op and Treatment may vary due to age or medical condition  Airway Management Planned: Oral ETT  Additional Equipment:   Intra-op Plan:   Post-operative Plan: Extubation in OR  Informed Consent: I have reviewed the patients History and Physical, chart, labs and discussed the procedure including the risks, benefits and alternatives for the proposed anesthesia with the patient or authorized representative who has indicated his/her understanding and acceptance.   Dental advisory given  Plan Discussed with: CRNA and Surgeon  Anesthesia Plan Comments:         Anesthesia Quick Evaluation

## 2016-07-21 NOTE — Progress Notes (Signed)
Patient ID: Linda Frazier, female   DOB: 1970/05/15, 46 y.o.   MRN: [id number] DOS from [facility] and bilat salpingectomy Pt with bladder pain from foley  VSS Good urine output A: stable P:d/c foley  Cont with pain management

## 2016-07-21 NOTE — Progress Notes (Signed)
Ready for [facility] and bilateral salpingectomy  NPO  HCG negative  Labs reviewed HCT 26.8 % All questions answered  Ready to proceed

## 2016-07-21 NOTE — Anesthesia Postprocedure Evaluation (Signed)
Anesthesia Post Note  Patient: Linda Frazier  Procedure(s) Performed: Procedure(s) (LRB): HYSTERECTOMY VAGINAL (N/A) BILATERAL SALPINGECTOMY (Bilateral)  Patient location during evaluation: PACU Anesthesia Type: General Level of consciousness: awake and alert and oriented Pain management: pain level controlled Vital Signs Assessment: post-procedure vital signs reviewed and stable Respiratory status: spontaneous breathing Cardiovascular status: blood pressure returned to baseline Anesthetic complications: no     Last Vitals:  Vitals:   07/21/16 1428 07/21/16 1451  BP:  (!) 152/70  Pulse: 63 68  Resp: 11 15  Temp:  36.6 C    Last Pain:  Vitals:   07/21/16 1451  TempSrc: Oral  PainSc:                  Leyda Vanderwerf

## 2016-07-21 NOTE — Anesthesia Procedure Notes (Signed)
Procedure Name: Intubation Performed by: Dyan Creelman Pre-anesthesia Checklist: Patient identified, Patient being monitored, Timeout performed, Emergency Drugs available and Suction available Patient Re-evaluated:Patient Re-evaluated prior to inductionOxygen Delivery Method: Circle system utilized Preoxygenation: Pre-oxygenation with 100% oxygen Intubation Type: IV induction Ventilation: Mask ventilation without difficulty Laryngoscope Size: Mac and 3 Grade View: Grade I Tube type: Oral Tube size: 7.0 mm Number of attempts: 1 Airway Equipment and Method: Stylet Placement Confirmation: ETT inserted through vocal cords under direct vision,  positive ETCO2 and breath sounds checked- equal and bilateral Secured at: 21 cm Tube secured with: Tape Dental Injury: Teeth and Oropharynx as per pre-operative assessment        

## 2016-07-21 NOTE — Brief Op Note (Signed)
07/21/2016  12:54 PM  PATIENT:  Cecilie Lowers  46 y.o. female  PRE-OPERATIVE DIAGNOSIS:  Menorrhagia Fibroids Endometrial Polyp  POST-OPERATIVE DIAGNOSIS:  Menorrhagia Fibroids Endometrial Polyp  PROCEDURE:  Procedure(s): HYSTERECTOMY VAGINAL (N/A) BILATERAL SALPINGECTOMY (Bilateral)  SURGEON:  Surgeon(s) and Role:    * Schermerhorn, Gwen Her, MD - Primary    * Benjaman Kindler, MD  PHYSICIAN ASSISTANT: scrub tech, Doman  ASSISTANTS: none   ANESTHESIA:   general  EBL:  Total I/O In: -  Out: 250 [Urine:150; Blood:100] iof 500 cc BLOOD ADMINISTERED:none  DRAINS: Urinary Catheter (Foley)   LOCAL MEDICATIONS USED:  LIDOCAINE  and OTHER with 1:100,000 epinephrine   SPECIMEN:  Source of Specimen:  cx , uterus , bilateral fallopian tubes   DISPOSITION OF SPECIMEN:  PATHOLOGY  COUNTS:  YES  TOURNIQUET:  * No tourniquets in log *  DICTATION: .Other Dictation: Dictation Number verbal  PLAN OF CARE: verbal  PATIENT DISPOSITION:  PACU - hemodynamically stable.   Delay start of Pharmacological VTE agent (>24hrs) due to surgical blood loss or risk of bleeding: not applicable

## 2016-07-22 DIAGNOSIS — N802 Endometriosis of fallopian tube: Secondary | ICD-10-CM | POA: Diagnosis not present

## 2016-07-22 LAB — BASIC METABOLIC PANEL
ANION GAP: 3 — AB (ref 5–15)
BUN: 8 mg/dL (ref 6–20)
CHLORIDE: 102 mmol/L (ref 101–111)
CO2: 28 mmol/L (ref 22–32)
Calcium: 9.3 mg/dL (ref 8.9–10.3)
Creatinine, Ser: 0.7 mg/dL (ref 0.44–1.00)
GFR calc non Af Amer: >60 mL/min (ref >60)
Glucose, Bld: 97 mg/dL (ref 65–99)
POTASSIUM: 4 mmol/L (ref 3.5–5.1)
SODIUM: 133 mmol/L — AB (ref 135–145)

## 2016-07-22 LAB — CBC
HCT: 25.7 % — ABNORMAL LOW (ref 35.0–47.0)
HEMOGLOBIN: 7.9 g/dL — AB (ref 12.0–16.0)
MCH: 20 pg — AB (ref 26.0–34.0)
MCHC: 30.9 g/dL — ABNORMAL LOW (ref 32.0–36.0)
MCV: 64.7 fL — ABNORMAL LOW (ref 80.0–100.0)
PLATELETS: 280 10*3/uL (ref 150–440)
RBC: 3.96 MIL/uL (ref 3.80–5.20)
RDW: 18.6 % — ABNORMAL HIGH (ref 11.5–14.5)
WBC: 13.8 10*3/uL — AB (ref 3.6–11.0)

## 2016-07-22 MED ORDER — IBUPROFEN 800 MG PO TABS: 800.0000 mg | ORAL_TABLET | Freq: Three times a day (TID) | ORAL | 0 refills | Status: DC | PRN

## 2016-07-22 MED ORDER — DOCUSATE SODIUM 100 MG PO CAPS: 100.0000 mg | ORAL_CAPSULE | Freq: Every day | ORAL | 2 refills | Status: AC | PRN

## 2016-07-22 MED ORDER — ONDANSETRON HCL 4 MG PO TABS: 4.0000 mg | ORAL_TABLET | Freq: Four times a day (QID) | ORAL | 0 refills | Status: DC | PRN

## 2016-07-22 MED ORDER — OXYCODONE-ACETAMINOPHEN 5-325 MG PO TABS: 1.0000 | ORAL_TABLET | ORAL | 0 refills | Status: DC | PRN

## 2016-07-22 NOTE — Op Note (Signed)
NAME:  Linda Frazier, Linda Frazier                      ACCOUNT NO.:  MEDICAL RECORD NO.:  [id number]  LOCATION:                                 FACILITY:  PHYSICIAN:  Laverta Baltimore, MD     DATE OF BIRTH:  DATE OF PROCEDURE: DATE OF DISCHARGE:                              OPERATIVE REPORT   PREOPERATIVE DIAGNOSIS: 1. Menorrhagia. 2. Fibroid uterus. 3. Endometrial polyp.  POSTOPERATIVE DIAGNOSIS: 1. Menorrhagia. 2. Fibroid uterus. 3. Endometrial polyp.  PROCEDURE PERFORMED: 1. Total vaginal hysterectomy. 2. Bilateral salpingectomy.  SURGEON:  Laverta Baltimore, MD  FIRST ASSISTANT:  Leafy Ro.  SECOND ASSISTANT:  Scrub tech, Doman.  INDICATION:  This is a 46 year old gravida 3, para 1 patient with a long history of menorrhagia and iron-deficiency anemia.  The patient was noted to have several fibroids on recent ultrasound and a 2 cm endometrial polyp.  The patient elects for definitive treatment.  DESCRIPTION OF PROCEDURE:  After adequate general endotracheal anesthesia, the patient was placed in dorsal supine position with legs in the candy-cane stirrups.  The patient had previously received 2 g IV cefoxitin prior to commencement of the case.  The patient's abdomen, perineum, and vagina were prepped and draped in normal sterile fashion. SCDs were on her legs.  Time-out was performed.  Straight catheterization was used to drain the bladder, yielded 100 mL of clear urine.  A weighted speculum was placed in the posterior vaginal vault. The cervix was grasped with 2 thyroid tenacula.  A direct posterior colpotomy incision was made and direct entry into the posterior cul-de- sac was performed.  A long-billed weighted speculum was then placed in the posterior vaginal vault.  The uterosacral ligaments were then bilaterally clamped, transected, suture ligated with 0 Vicryl suture. Anterior cervix was circumferentially incised with the Bovie.  Cardinal ligaments were then bilaterally  clamped, transected, suture ligated with 0 Vicryl suture.  Entry into the anterior cul-de-sac was accomplished with sharp dissection, and a Deaver retractor was placed within to reflect the bladder anteriorly.  The uterine arteries were then bilaterally clamped and transected, suture ligated with 0 Vicryl suture. Given the size of the uterus approximately 9 to 10 weeks in size with multiple fibroids, the cervix was initially cored with removal of the cervix and the endometrium with several fibroids attached.  Morcellation continued and ultimately the cornua were bilaterally clamped and transected, and the uterus was delivered.  The cornual pedicles were doubly ligated with 0 Vicryl suture.  Each fallopian tube was clamped at the mesosalpinx and each fallopian tube was removed and each pedicle was closed with 0 Vicryl suture.  Good hemostasis was noted.  The peritoneum was then reapproximated with a 2-0 PDS pursestring suture, and the vaginal cuff was then closed with a running 0 Vicryl suture with good approximation of tissues.  Good hemostasis was noted.  A Foley catheter was placed into the bladder at the end of the case yielding additional 50 mL of clear urine.  There were no complications.  The patient tolerated the procedure well.  INTRAOPERATIVE FLUIDS:  500 mL.  URINE OUTPUT:  150 mL.  ESTIMATED BLOOD LOSS:  100 mL.  The patient was taken to recovery room in good condition.          ______________________________ Laverta Baltimore, MD     TS/MEDQ  D:  07/21/2016  T:  07/22/2016  Job:  [id number]

## 2016-07-22 NOTE — Progress Notes (Signed)
Discharge Instructions reviewed with patient and spouse with patient's permission. Patient verbalized understanding. Patient given prescription for percocet and patient and reminded not to drink, drive or operate heavy machinery while taking any sedative medication. Patient reminded of follow up appointment with MD. Patient left the unit via wheelchair, patient's spouse to drive her home.

## 2016-07-22 NOTE — Discharge Summary (Signed)
Physician Discharge Summary  Patient ID: Linda Frazier MRN: [id number] DOB/AGE: 1970/06/19 46 y.o.  Admit date: 07/21/2016 Discharge date: 07/22/2016  Admission Diagnoses:menorrhagia, fibroids  Discharge Diagnoses: same Active Problems:   Postoperative state   Discharged Condition: good  Hospital Course: pt underwent an uncomplicated TVH and bilateral salpingectomy .   Consults: None  Significant Diagnostic Studies: labs:  Results for orders placed or performed during the hospital encounter of 07/21/16 (from the past 24 hour(s))  Pregnancy, urine POC     Status: None   Collection Time: 07/21/16 10:06 AM  Result Value Ref Range   Preg Test, Ur NEGATIVE NEGATIVE  ABO/Rh     Status: None   Collection Time: 07/21/16 10:54 AM  Result Value Ref Range   ABO/RH(D) B POS   CBC     Status: Abnormal   Collection Time: 07/22/16  6:24 AM  Result Value Ref Range   WBC 13.8 (H) 3.6 - 11.0 K/uL   RBC 3.96 3.80 - 5.20 MIL/uL   Hemoglobin 7.9 (L) 12.0 - 16.0 g/dL   HCT 25.7 (L) 35.0 - 47.0 %   MCV 64.7 (L) 80.0 - 100.0 fL   MCH 20.0 (L) 26.0 - 34.0 pg   MCHC 30.9 (L) 32.0 - 36.0 g/dL   RDW 18.6 (H) 11.5 - 14.5 %   Platelets 280 150 - 440 K/uL  Basic metabolic panel     Status: Abnormal   Collection Time: 07/22/16  6:24 AM  Result Value Ref Range   Sodium 133 (L) 135 - 145 mmol/L   Potassium 4.0 3.5 - 5.1 mmol/L   Chloride 102 101 - 111 mmol/L   CO2 28 22 - 32 mmol/L   Glucose, Bld 97 65 - 99 mg/dL   BUN 8 6 - 20 mg/dL   Creatinine, Ser 0.70 0.44 - 1.00 mg/dL   Calcium 9.3 8.9 - 10.3 mg/dL   GFR calc non Af Amer >60 >60 mL/min   GFR calc Af Amer >60 >60 mL/min   Anion gap 3 (L) 5 - 15    Treatments: surgery: as above  Discharge Exam: Blood pressure (!) 103/58, pulse (!) 57, temperature 97.8 F (36.6 C), temperature source Oral, resp. rate 18, last menstrual period 07/09/2016, SpO2 100 %. General appearance: alert and cooperative Resp: clear to auscultation bilaterally Cardio:  regular rate and rhythm, S1, S2 normal, no murmur, click, rub or gallop GI: soft, non-tender; bowel sounds normal; no masses,  no organomegaly  Disposition:   Discharge Instructions    Call MD for:  difficulty breathing, headache or visual disturbances    Complete by:  As directed    Call MD for:  extreme fatigue    Complete by:  As directed    Call MD for:  hives    Complete by:  As directed    Call MD for:  persistant dizziness or light-headedness    Complete by:  As directed    Call MD for:  persistant nausea and vomiting    Complete by:  As directed    Call MD for:  redness, tenderness, or signs of infection (pain, swelling, redness, odor or green/yellow discharge around incision site)    Complete by:  As directed    Call MD for:  severe uncontrolled pain    Complete by:  As directed    Call MD for:  temperature >100.4    Complete by:  As directed    Diet - low sodium heart healthy    Complete  by:  As directed    Increase activity slowly    Complete by:  As directed      Allergies as of 07/22/2016   No Known Allergies     Medication List    TAKE these medications   docusate sodium 100 MG capsule Commonly known as:  COLACE Take 1 capsule (100 mg total) by mouth daily as needed.   ibuprofen 800 MG tablet Commonly known as:  ADVIL,MOTRIN Take 1 tablet (800 mg total) by mouth every 8 (eight) hours as needed.   MULTIVITAMIN GUMMIES WOMENS PO Take 2 tablets by mouth at bedtime.   ondansetron 4 MG tablet Commonly known as:  ZOFRAN Take 1 tablet (4 mg total) by mouth every 6 (six) hours as needed for nausea.   oxyCODONE-acetaminophen 5-325 MG tablet Commonly known as:  PERCOCET/ROXICET Take 1-2 tablets by mouth every 4 (four) hours as needed (moderate to severe pain (when tolerating fluids)).        Signed: Gwen Her Schermerhorn 07/22/2016, 8:43 AM

## 2016-07-24 LAB — SURGICAL PATHOLOGY

## 2016-08-05 DIAGNOSIS — N809 Endometriosis, unspecified: Secondary | ICD-10-CM | POA: Insufficient documentation

## 2019-04-28 ENCOUNTER — Ambulatory Visit: Payer: Managed Care, Other (non HMO) | Attending: Internal Medicine

## 2019-04-28 ENCOUNTER — Other Ambulatory Visit: Payer: Self-pay

## 2019-04-28 DIAGNOSIS — Z23 Encounter for immunization: Secondary | ICD-10-CM

## 2019-04-28 NOTE — Progress Notes (Signed)
   Covid-19 Vaccination Clinic  Name:  Linda Frazier    MRN: [id number] DOB: 1970-02-11  04/28/2019  Ms. Care was observed post Covid-19 immunization for 15 minutes without incident. She was provided with Vaccine Information Sheet and instruction to access the V-Safe system.   [name] was instructed to call 911 with any severe reactions post vaccine: Marland Kitchen Difficulty breathing  . Swelling of face and throat  . A fast heartbeat  . A bad rash all over body  . Dizziness and weakness   Immunizations Administered    Name Date Dose VIS Date Route   Pfizer COVID-19 Vaccine 04/28/2019  8:19 AM 0.3 mL 12/31/2018 Intramuscular   Manufacturer: [place]   Lot: [id number]   [place]: [id number]

## 2019-05-24 ENCOUNTER — Ambulatory Visit: Payer: Managed Care, Other (non HMO) | Attending: Internal Medicine

## 2019-05-24 DIAGNOSIS — Z23 Encounter for immunization: Secondary | ICD-10-CM

## 2019-05-24 NOTE — Progress Notes (Signed)
   Covid-19 Vaccination Clinic  Name:  Loanna Grandison    MRN: [id number] DOB: 18-Jan-1971  05/24/2019  Ms. Almasri was observed post Covid-19 immunization for 15 minutes without incident. She was provided with Vaccine Information Sheet and instruction to access the V-Safe system.   Ms. Hopf was instructed to call 911 with any severe reactions post vaccine: Marland Kitchen Difficulty breathing  . Swelling of face and throat  . A fast heartbeat  . A bad rash all over body  . Dizziness and weakness   Immunizations Administered    Name Date Dose VIS Date Route   Pfizer COVID-19 Vaccine 05/24/2019  9:54 AM 0.3 mL 03/16/2018 Intramuscular   Manufacturer: [place]   Lot: [id number]   [place]: [id number]

## 2019-06-17 ENCOUNTER — Ambulatory Visit: Payer: Self-pay | Admitting: Adult Health

## 2019-10-12 ENCOUNTER — Encounter: Payer: Self-pay | Admitting: Adult Health

## 2019-10-12 ENCOUNTER — Other Ambulatory Visit: Payer: Self-pay

## 2019-10-12 ENCOUNTER — Ambulatory Visit (INDEPENDENT_AMBULATORY_CARE_PROVIDER_SITE_OTHER): Payer: 59 | Admitting: Adult Health

## 2019-10-12 VITALS — BP 156/94 | HR 88 | Temp 97.9°F | Resp 16 | Ht 61.0 in | Wt 122.0 lb

## 2019-10-12 DIAGNOSIS — R319 Hematuria, unspecified: Secondary | ICD-10-CM

## 2019-10-12 DIAGNOSIS — Z90711 Acquired absence of uterus with remaining cervical stump: Secondary | ICD-10-CM | POA: Insufficient documentation

## 2019-10-12 DIAGNOSIS — Z0000 Encounter for general adult medical examination without abnormal findings: Secondary | ICD-10-CM | POA: Diagnosis not present

## 2019-10-12 DIAGNOSIS — Z1231 Encounter for screening mammogram for malignant neoplasm of breast: Secondary | ICD-10-CM

## 2019-10-12 DIAGNOSIS — Z1159 Encounter for screening for other viral diseases: Secondary | ICD-10-CM

## 2019-10-12 DIAGNOSIS — Z1389 Encounter for screening for other disorder: Secondary | ICD-10-CM

## 2019-10-12 DIAGNOSIS — L659 Nonscarring hair loss, unspecified: Secondary | ICD-10-CM

## 2019-10-12 DIAGNOSIS — I10 Essential (primary) hypertension: Secondary | ICD-10-CM

## 2019-10-12 LAB — POCT URINALYSIS DIPSTICK
Glucose, UA: NEGATIVE
Leukocytes, UA: NEGATIVE
Nitrite, UA: NEGATIVE
Protein, UA: POSITIVE — AB
Spec Grav, UA: >=1.03 — AB (ref 1.010–1.025)
Urobilinogen, UA: 0.2 E.U./dL
pH, UA: 5 (ref 5.0–8.0)

## 2019-10-12 MED ORDER — HYDROCHLOROTHIAZIDE 25 MG PO TABS: 25.0000 mg | ORAL_TABLET | Freq: Every day | ORAL | 0 refills | Status: DC

## 2019-10-12 NOTE — Patient Instructions (Addendum)
Call to schedule your screening mammogram. Your orders have been placed for your exam.  Let our office know if you have questions, concerns, or any difficulty scheduling.  If normal results then yearly screening mammograms are recommended unless you notice  Changes in your breast then you should schedule a follow up office visit. If abnormal results  Further imaging will be warranted and sooner follow up as determined by the radiologist at the [facility].   [facility] [place], [place] [address]  Main: [phone]      Blood Pressure Record Sheet To take your blood pressure, you will need a blood pressure machine. You can buy a blood pressure machine (blood pressure monitor) at your clinic, drug store, or online. When choosing one, consider:  An automatic monitor that has an arm cuff.  A cuff that wraps snugly around your upper arm. You should be able to fit only one finger between your arm and the cuff.  A device that stores blood pressure reading results.  Do not choose a monitor that measures your blood pressure from your wrist or finger. Follow your health care provider's instructions for how to take your blood pressure. To use this form:  Get one reading in the morning (a.m.) before you take any medicines.  Get one reading in the evening (p.m.) before supper.  Take at least 2 readings with each blood pressure check. This makes sure the results are correct. Wait 1-2 minutes between measurements.  Write down the results in the spaces on this form.  Repeat this once a week, or as told by your health care provider.  Make a follow-up appointment with your health care provider to discuss the results. Blood pressure log Date: _______________________  a.m. _____________________(1st reading) _____________________(2nd reading)  p.m. _____________________(1st reading) _____________________(2nd reading) Date:  _______________________  a.m. _____________________(1st reading) _____________________(2nd reading)  p.m. _____________________(1st reading) _____________________(2nd reading) Date: _______________________  a.m. _____________________(1st reading) _____________________(2nd reading)  p.m. _____________________(1st reading) _____________________(2nd reading) Date: _______________________  a.m. _____________________(1st reading) _____________________(2nd reading)  p.m. _____________________(1st reading) _____________________(2nd reading) Date: _______________________  a.m. _____________________(1st reading) _____________________(2nd reading)  p.m. _____________________(1st reading) _____________________(2nd reading) This information is not intended to replace advice given to you by your health care provider. Make sure you discuss any questions you have with your health care provider. Document Revised: 03/06/2017 Document Reviewed: 01/06/2017 Elsevier Patient Education  2020 [place] Maintenance, Female Adopting a healthy lifestyle and getting preventive care are important in promoting health and wellness. Ask your health care provider about:  The right schedule for you to have regular tests and exams.  Things you can do on your own to prevent diseases and keep yourself healthy. What should I know about diet, weight, and exercise? Eat a healthy diet   Eat a diet that includes plenty of vegetables, fruits, low-fat dairy products, and lean protein.  Do not eat a lot of foods that are high in solid fats, added sugars, or sodium. Maintain a healthy weight Body mass index (BMI) is used to identify weight problems. It estimates body fat based on height and weight. Your health care provider can help determine your BMI and help you achieve or maintain a healthy weight. Get regular exercise Get regular exercise. This is one of the most important things you can do for your  health. Most adults should:  Exercise for at least 150 minutes each week. The exercise should increase your heart rate and make you sweat (moderate-intensity  exercise).  Do strengthening exercises at least twice a week. This is in addition to the moderate-intensity exercise.  Spend less time sitting. Even light physical activity can be beneficial. Watch cholesterol and blood lipids Have your blood tested for lipids and cholesterol at 47 years of age, then have this test every 5 years. Have your cholesterol levels checked more often if:  Your lipid or cholesterol levels are high.  You are older than 47 years of age.  You are at high risk for heart disease. What should I know about cancer screening? Depending on your health history and family history, you may need to have cancer screening at various ages. This may include screening for:  Breast cancer.  Cervical cancer.  Colorectal cancer.  Skin cancer.  Lung cancer. What should I know about heart disease, diabetes, and high blood pressure? Blood pressure and heart disease  High blood pressure causes heart disease and increases the risk of stroke. This is more likely to develop in people who have high blood pressure readings, are of African descent, or are overweight.  Have your blood pressure checked: ? Every 3-5 years if you are 37-66 years of age. ? Every year if you are 75 years old or older. Diabetes Have regular diabetes screenings. This checks your fasting blood sugar level. Have the screening done:  Once every three years after age 7 if you are at a normal weight and have a low risk for diabetes.  More often and at a younger age if you are overweight or have a high risk for diabetes. What should I know about preventing infection? Hepatitis B If you have a higher risk for hepatitis B, you should be screened for this virus. Talk with your health care provider to find out if you are at risk for hepatitis B  infection. Hepatitis C Testing is recommended for:  Everyone born from [birth year] through 1963.  Anyone with known risk factors for hepatitis C. Sexually transmitted infections (STIs)  Get screened for STIs, including gonorrhea and chlamydia, if: ? You are sexually active and are younger than 47 years of age. ? You are older than 47 years of age and your health care provider tells you that you are at risk for this type of infection. ? Your sexual activity has changed since you were last screened, and you are at increased risk for chlamydia or gonorrhea. Ask your health care provider if you are at risk.  Ask your health care provider about whether you are at high risk for HIV. Your health care provider may recommend a prescription medicine to help prevent HIV infection. If you choose to take medicine to prevent HIV, you should first get tested for HIV. You should then be tested every 3 months for as long as you are taking the medicine. Pregnancy  If you are about to stop having your period (premenopausal) and you may become pregnant, seek counseling before you get pregnant.  Take 400 to 800 micrograms (mcg) of folic acid every day if you become pregnant.  Ask for birth control (contraception) if you want to prevent pregnancy. Osteoporosis and menopause Osteoporosis is a disease in which the bones lose minerals and strength with aging. This can result in bone fractures. If you are 57 years old or older, or if you are at risk for osteoporosis and fractures, ask your health care provider if you should:  Be screened for bone loss.  Take a calcium or vitamin D supplement to lower your risk  of fractures.  Be given hormone replacement therapy (HRT) to treat symptoms of menopause. Follow these instructions at home: Lifestyle  Do not use any products that contain nicotine or tobacco, such as cigarettes, e-cigarettes, and chewing tobacco. If you need help quitting, ask your health care  provider.  Do not use street drugs.  Do not share needles.  Ask your health care provider for help if you need support or information about quitting drugs. Alcohol use  Do not drink alcohol if: ? Your health care provider tells you not to drink. ? You are pregnant, may be pregnant, or are planning to become pregnant.  If you drink alcohol: ? Limit how much you use to 0-1 drink a day. ? Limit intake if you are breastfeeding.  Be aware of how much alcohol is in your drink. In the [place] one drink equals one 12 oz bottle of beer (355 mL), one 5 oz glass of wine (148 mL), or one 1 oz glass of hard liquor (44 mL). General instructions  Schedule regular health, dental, and eye exams.  Stay current with your vaccines.  Tell your health care provider if: ? You often feel depressed. ? You have ever been abused or do not feel safe at home. Summary  Adopting a healthy lifestyle and getting preventive care are important in promoting health and wellness.  Follow your health care provider's instructions about healthy diet, exercising, and getting tested or screened for diseases.  Follow your health care provider's instructions on monitoring your cholesterol and blood pressure. This information is not intended to replace advice given to you by your health care provider. Make sure you discuss any questions you have with your health care provider. Document Revised: 12/30/2017 Document Reviewed: 12/30/2017 Elsevier Patient Education  [place] DASH stands for "Dietary Approaches to Stop Hypertension." The DASH eating plan is a healthy eating plan that has been shown to reduce high blood pressure (hypertension). It may also reduce your risk for type 2 diabetes, heart disease, and stroke. The DASH eating plan may also help with weight loss. What are tips for following this plan?  General guidelines  Avoid eating more than 2,300 mg (milligrams) of salt  (sodium) a day. If you have hypertension, you may need to reduce your sodium intake to 1,500 mg a day.  Limit alcohol intake to no more than 1 drink a day for nonpregnant women and 2 drinks a day for men. One drink equals 12 oz of beer, 5 oz of wine, or 1 oz of hard liquor.  Work with your health care provider to maintain a healthy body weight or to lose weight. Ask what an ideal weight is for you.  Get at least 30 minutes of exercise that causes your heart to beat faster (aerobic exercise) most days of the week. Activities may include walking, swimming, or biking.  Work with your health care provider or diet and nutrition specialist (dietitian) to adjust your eating plan to your individual calorie needs. Reading food labels   Check food labels for the amount of sodium per serving. Choose foods with less than 5 percent of the Daily Value of sodium. Generally, foods with less than 300 mg of sodium per serving fit into this eating plan.  To find whole grains, look for the word "whole" as the first word in the ingredient list. Shopping  Buy products labeled as "low-sodium" or "no salt added."  Buy fresh foods. Avoid canned  foods and premade or frozen meals. Cooking  Avoid adding salt when cooking. Use salt-free seasonings or herbs instead of table salt or sea salt. Check with your health care provider or pharmacist before using salt substitutes.  Do not fry foods. Cook foods using healthy methods such as baking, boiling, grilling, and broiling instead.  Cook with heart-healthy oils, such as olive, canola, soybean, or sunflower oil. Meal planning  Eat a balanced diet that includes: ? 5 or more servings of fruits and vegetables each day. At each meal, try to fill half of your plate with fruits and vegetables. ? Up to 6-8 servings of whole grains each day. ? Less than 6 oz of lean meat, poultry, or fish each day. A 3-oz serving of meat is about the same size as a deck of cards. One egg  equals 1 oz. ? 2 servings of low-fat dairy each day. ? A serving of nuts, seeds, or beans 5 times each week. ? Heart-healthy fats. Healthy fats called Omega-3 fatty acids are found in foods such as flaxseeds and coldwater fish, like sardines, salmon, and mackerel.  Limit how much you eat of the following: ? Canned or prepackaged foods. ? Food that is high in trans fat, such as fried foods. ? Food that is high in saturated fat, such as fatty meat. ? Sweets, desserts, sugary drinks, and other foods with added sugar. ? Full-fat dairy products.  Do not salt foods before eating.  Try to eat at least 2 vegetarian meals each week.  Eat more home-cooked food and less restaurant, buffet, and fast food.  When eating at a restaurant, ask that your food be prepared with less salt or no salt, if possible. What foods are recommended? The items listed may not be a complete list. Talk with your dietitian about what dietary choices are best for you. Grains Whole-grain or whole-wheat bread. Whole-grain or whole-wheat pasta. Brown rice. [place]. Bulgur. Whole-grain and low-sodium cereals. Pita bread. Low-fat, low-sodium crackers. Whole-wheat flour tortillas. Vegetables Fresh or frozen vegetables (raw, steamed, roasted, or grilled). Low-sodium or reduced-sodium tomato and vegetable juice. Low-sodium or reduced-sodium tomato sauce and tomato paste. Low-sodium or reduced-sodium canned vegetables. Fruits All fresh, dried, or frozen fruit. Canned fruit in natural juice (without added sugar). Meat and other protein foods Skinless chicken or [place]. Ground chicken or [place]. Pork with fat trimmed off. Fish and seafood. Egg whites. Dried beans, peas, or lentils. Unsalted nuts, nut butters, and seeds. Unsalted canned beans. Lean cuts of beef with fat trimmed off. Low-sodium, lean deli meat. Dairy Low-fat (1%) or fat-free (skim) milk. Fat-free, low-fat, or reduced-fat cheeses. Nonfat, low-sodium ricotta or  cottage cheese. Low-fat or nonfat yogurt. Low-fat, low-sodium cheese. Fats and oils Soft margarine without trans fats. Vegetable oil. Low-fat, reduced-fat, or light mayonnaise and salad dressings (reduced-sodium). Canola, safflower, olive, soybean, and sunflower oils. Avocado. Seasoning and other foods Herbs. Spices. Seasoning mixes without salt. Unsalted popcorn and pretzels. Fat-free sweets. What foods are not recommended? The items listed may not be a complete list. Talk with your dietitian about what dietary choices are best for you. Grains Baked goods made with fat, such as croissants, muffins, or some breads. Dry pasta or rice meal packs. Vegetables Creamed or fried vegetables. Vegetables in a cheese sauce. Regular canned vegetables (not low-sodium or reduced-sodium). Regular canned tomato sauce and paste (not low-sodium or reduced-sodium). Regular tomato and vegetable juice (not low-sodium or reduced-sodium). Angie Fava. Olives. Fruits Canned fruit in a light or heavy syrup. [place]ace[place] Pro  fruit. Fruit in cream or butter sauce. Meat and other protein foods Fatty cuts of meat. Ribs. Fried meat. Berniece Salines. Sausage. Bologna and other processed lunch meats. Salami. Fatback. Hotdogs. Bratwurst. Salted nuts and seeds. Canned beans with added salt. Canned or smoked fish. Whole eggs or egg yolks. Chicken or [place] with skin. Dairy Whole or 2% milk, cream, and half-and-half. Whole or full-fat cream cheese. Whole-fat or sweetened yogurt. Full-fat cheese. Nondairy creamers. Whipped toppings. Processed cheese and cheese spreads. Fats and oils Butter. Stick margarine. Lard. Shortening. Ghee. Bacon fat. Tropical oils, such as coconut, palm kernel, or palm oil. Seasoning and other foods Salted popcorn and pretzels. Onion salt, garlic salt, seasoned salt, table salt, and sea salt. Worcestershire sauce. Tartar sauce. Barbecue sauce. Teriyaki sauce. Soy sauce, including reduced-sodium. Steak sauce. Canned and packaged  gravies. Fish sauce. Oyster sauce. Cocktail sauce. Horseradish that you find on the shelf. Ketchup. Mustard. Meat flavorings and tenderizers. Bouillon cubes. Hot sauce and Tabasco sauce. Premade or packaged marinades. Premade or packaged taco seasonings. Relishes. Regular salad dressings. Where to find more information:  National Heart, Lung, and [place]: https://wilson-eaton.com/  American Heart Association: www.heart.org Summary  The DASH eating plan is a healthy eating plan that has been shown to reduce high blood pressure (hypertension). It may also reduce your risk for type 2 diabetes, heart disease, and stroke.  With the DASH eating plan, you should limit salt (sodium) intake to 2,300 mg a day. If you have hypertension, you may need to reduce your sodium intake to 1,500 mg a day.  When on the DASH eating plan, aim to eat more fresh fruits and vegetables, whole grains, lean proteins, low-fat dairy, and heart-healthy fats.  Work with your health care provider or diet and nutrition specialist (dietitian) to adjust your eating plan to your individual calorie needs. This information is not intended to replace advice given to you by your health care provider. Make sure you discuss any questions you have with your health care provider. Document Revised: 12/19/2016 Document Reviewed: 12/31/2015 Elsevier Patient Education  2020 [employer].  Hypertension, Adult Hypertension is another name for high blood pressure. High blood pressure forces your heart to work harder to pump blood. This can cause problems over time. There are two numbers in a blood pressure reading. There is a top number (systolic) over a bottom number (diastolic). It is best to have a blood pressure that is below 120/80. Healthy choices can help lower your blood pressure, or you may need medicine to help lower it. What are the causes? The cause of this condition is not known. Some conditions may be related to high blood  pressure. What increases the risk?  Smoking.  Having type 2 diabetes mellitus, high cholesterol, or both.  Not getting enough exercise or physical activity.  Being overweight.  Having too much fat, sugar, calories, or salt (sodium) in your diet.  Drinking too much alcohol.  Having long-term (chronic) kidney disease.  Having a family history of high blood pressure.  Age. Risk increases with age.  Race. You may be at higher risk if you are African American.  Gender. Men are at higher risk than women before age 74. After age 74, women are at higher risk than men.  Having obstructive sleep apnea.  Stress. What are the signs or symptoms?  High blood pressure may not cause symptoms. Very high blood pressure (hypertensive crisis) may cause: ? Headache. ? Feelings of worry or nervousness (anxiety). ? Shortness of breath. ?  Nosebleed. ? A feeling of being sick to your stomach (nausea). ? Throwing up (vomiting). ? Changes in how you see. ? Very bad chest pain. ? Seizures. How is this treated?  This condition is treated by making healthy lifestyle changes, such as: ? Eating healthy foods. ? Exercising more. ? Drinking less alcohol.  Your health care provider may prescribe medicine if lifestyle changes are not enough to get your blood pressure under control, and if: ? Your top number is above 130. ? Your bottom number is above 80.  Your personal target blood pressure may vary. Follow these instructions at home: Eating and drinking   If told, follow the DASH eating plan. To follow this plan: ? Fill one half of your plate at each meal with fruits and vegetables. ? Fill one fourth of your plate at each meal with whole grains. Whole grains include whole-wheat pasta, brown rice, and whole-grain bread. ? Eat or drink low-fat dairy products, such as skim milk or low-fat yogurt. ? Fill one fourth of your plate at each meal with low-fat (lean) proteins. Low-fat proteins include  fish, chicken without skin, eggs, beans, and tofu. ? Avoid fatty meat, cured and processed meat, or chicken with skin. ? Avoid pre-made or processed food.  Eat less than 1,500 mg of salt each day.  Do not drink alcohol if: ? Your doctor tells you not to drink. ? You are pregnant, may be pregnant, or are planning to become pregnant.  If you drink alcohol: ? Limit how much you use to:  0-1 drink a day for women.  0-2 drinks a day for men. ? Be aware of how much alcohol is in your drink. In the [place] one drink equals one 12 oz bottle of beer (355 mL), one 5 oz glass of wine (148 mL), or one 1 oz glass of hard liquor (44 mL). Lifestyle   Work with your doctor to stay at a healthy weight or to lose weight. Ask your doctor what the best weight is for you.  Get at least 30 minutes of exercise most days of the week. This may include walking, swimming, or biking.  Get at least 30 minutes of exercise that strengthens your muscles (resistance exercise) at least 3 days a week. This may include lifting weights or doing Pilates.  Do not use any products that contain nicotine or tobacco, such as cigarettes, e-cigarettes, and chewing tobacco. If you need help quitting, ask your doctor.  Check your blood pressure at home as told by your doctor.  Keep all follow-up visits as told by your doctor. This is important. Medicines  Take over-the-counter and prescription medicines only as told by your doctor. Follow directions carefully.  Do not skip doses of blood pressure medicine. The medicine does not work as well if you skip doses. Skipping doses also puts you at risk for problems.  Ask your doctor about side effects or reactions to medicines that you should watch for. Contact a doctor if you:  Think you are having a reaction to the medicine you are taking.  Have headaches that keep coming back (recurring).  Feel dizzy.  Have swelling in your ankles.  Have trouble with your vision. Get  help right away if you:  Get a very bad headache.  Start to feel mixed up (confused).  Feel weak or numb.  Feel faint.  Have very bad pain in your: ? Chest. ? Belly (abdomen).  Throw up more than once.  Have trouble breathing.  Summary  Hypertension is another name for high blood pressure.  High blood pressure forces your heart to work harder to pump blood.  For most people, a normal blood pressure is less than 120/80.  Making healthy choices can help lower blood pressure. If your blood pressure does not get lower with healthy choices, you may need to take medicine. This information is not intended to replace advice given to you by your health care provider. Make sure you discuss any questions you have with your health care provider. Document Revised: 09/16/2017 Document Reviewed: 09/16/2017 Elsevier Patient Education  [place].  Hydrochlorothiazide, HCTZ Oral Capsules or Tablets What is this medicine? HYDROCHLOROTHIAZIDE (hye droe klor oh THYE a zide) is a diuretic. It helps you make more urine and to lose salt and excess water from your body. It treats swelling from heart, kidney, or liver disease. It also treats high blood pressure. This medicine may be used for other purposes; ask your health care provider or pharmacist if you have questions. COMMON BRAND NAME(S): Esidrix, Ezide, HydroDIURIL, Microzide, Oretic, Zide What should I tell my health care provider before I take this medicine? They need to know if you have any of these conditions:  diabetes  gout  immune system problems, like lupus  kidney disease or kidney stones  liver disease  pancreatitis  small amount of urine or difficulty passing urine  an unusual or allergic reaction to hydrochlorothiazide, sulfa drugs, other medicines, foods, dyes, or preservatives  pregnant or trying to get pregnant  breast-feeding How should I use this medicine? Take this drug by mouth. Take it as directed on  the prescription label at the same time every day. You can take it with or without food. If it upsets your stomach, take it with food. Keep taking it unless your health care provider tells you to stop. Talk to your health care provider about the use of this drug in children. While it may be prescribed for children as young as newborns for selected conditions, precautions do apply. Overdosage: If you think you have taken too much of this medicine contact a poison control center or emergency room at once. NOTE: This medicine is only for you. Do not share this medicine with others. What if I miss a dose? If you miss a dose, take it as soon as you can. If it is almost time for your next dose, take only that dose. Do not take double or extra doses. What may interact with this medicine?  cholestyramine  colestipol  digoxin  dofetilide  lithium  medicines for blood pressure  medicines for diabetes  medicines that relax muscles for surgery  other diuretics  steroid medicines like prednisone or cortisone This list may not describe all possible interactions. Give your health care provider a list of all the medicines, herbs, non-prescription drugs, or dietary supplements you use. Also tell them if you smoke, drink alcohol, or use illegal drugs. Some items may interact with your medicine. What should I watch for while using this medicine? Visit your doctor or health care professional for regular checks on your progress. Check your blood pressure as directed. Ask your doctor or health care professional what your blood pressure should be and when you should contact him or her. Talk to your health care professional about your risk of skin cancer. You may be more at risk for skin cancer if you take this medicine. This medicine can make you more sensitive to the sun. Keep out of the  sun. If you cannot avoid being in the sun, wear protective clothing and use sunscreen. Do not use sun lamps or tanning  beds/booths. You may need to be on a special diet while taking this medicine. Ask your doctor. Check with your doctor or health care professional if you get an attack of severe diarrhea, nausea and vomiting, or if you sweat a lot. The loss of too much body fluid can make it dangerous for you to take this medicine. You may get drowsy or dizzy. Do not drive, use machinery, or do anything that needs mental alertness until you know how this medicine affects you. Do not stand or sit up quickly, especially if you are an older patient. This reduces the risk of dizzy or fainting spells. Alcohol may interfere with the effect of this medicine. Avoid alcoholic drinks. This medicine may increase blood sugar. Ask your healthcare provider if changes in diet or medicines are needed if you have diabetes. What side effects may I notice from receiving this medicine? Side effects that you should report to your doctor or health care professional as soon as possible:  allergic reactions such as skin rash or itching, hives, swelling of the lips, mouth, tongue, or throat  changes in vision  chest pain  eye pain  fast or irregular heartbeat  feeling faint or lightheaded, falls  gout attack  muscle pain or cramps  pain or difficulty when passing urine  pain, tingling, numbness in the hands or feet  redness, blistering, peeling or loosening of the skin, including inside the mouth   signs and symptoms of high blood sugar such as being more thirsty or hungry or having to urinate more than normal. You may also feel very tired or have blurry vision.  unusually weak Side effects that usually do not require medical attention (report to your doctor or health care professional if they continue or are bothersome):  change in sex drive or performance  dry mouth  headache  stomach upset This list may not describe all possible side effects. Call your doctor for medical advice about side effects. You may report  side effects to FDA at 1-800-FDA-1088. Where should I keep my medicine? Keep out of the reach of children and pets. Store at room temperature between 20 and 25 degrees C (68 and 77 degrees F). Protect from light and moisture. Keep the container tightly closed. Do not freeze. Throw away any unused drug after the expiration date. NOTE: This sheet is a summary. It may not cover all possible information. If you have questions about this medicine, talk to your doctor, pharmacist, or health care provider.  2020 Elsevier/Gold Standard (2018-09-09 16:52:59)

## 2019-10-12 NOTE — Progress Notes (Signed)
New patient visit   Patient: Linda Frazier   DOB: 15-Dec-1970   49 y.o. Female  MRN: [id number] Visit Date: 10/12/2019  Today's healthcare provider: Marcille Buffy, FNP   Chief Complaint  Patient presents with  . New Patient (Initial Visit)   Subjective      HPI  Linda Frazier is a 49 y.o. female who presents today as a new patient to establish care.Patient comes to our office from [facility], she states that she feels fairly well today but would like to address concern of elevated blood pressure. Patient reports family history of hypertension and states that she has a electronic cuff at home and reports elevated readings. Patient denies symptoms of chest pain/pressure, shortness of breath or headache.   She reports blood pressure reading at home has been systolic 500'X to 381'W over 29-93 diastolic. She denies any family history of kidney disease. Her mother had a mild TIA she reports.  She has occasional mild headache when blood pressure elevated.  Diet has room for improvement. Not routinely exercising.  Does not eat a lot of salt she reports. Has occasional mild edema in lower legs/ feet/ ankle.  Is over due for mammogram.  She would like to defer colonoscopy and or cologuard until later. Discussed new age recommendations and she meets criteria.   Denies any abnormal stools, blood or dark color. Denies rectal bleeding.    Patient  denies any fever, body aches,chills, rash, chest pain, shortness of breath, nausea, vomiting, or diarrhea.   Denies dizziness, lightheadedness, pre syncopal or syncopal episodes.    Past Medical History:  Diagnosis Date  . Medical history non-contributory    Past Surgical History:  Procedure Laterality Date  . BILATERAL SALPINGECTOMY Bilateral 07/21/2016   Procedure: BILATERAL SALPINGECTOMY;  Surgeon: Schermerhorn, Gwen Her, MD;  Location: [facility] ORS;  Service: Gynecology;  Laterality: Bilateral;  . NO PAST SURGERIES    .  VAGINAL HYSTERECTOMY N/A 07/21/2016   Procedure: HYSTERECTOMY VAGINAL;  Surgeon: Schermerhorn, Gwen Her, MD;  Location: [facility] ORS;  Service: Gynecology;  Laterality: N/A;   Family Status  Relation Name Status  . MGM  (Not Specified)  . Mother  (Not Specified)   Family History  Problem Relation Age of Onset  . Colon cancer Maternal Grandmother   . Hypertension Mother    Social History   Socioeconomic History  . Marital status: Married    Spouse name: Not on file  . Number of children: Not on file  . Years of education: Not on file  . Highest education level: Not on file  Occupational History  . Not on file  Tobacco Use  . Smoking status: Never Smoker  . Smokeless tobacco: Never Used  Vaping Use  . Vaping Use: Never used  Substance and Sexual Activity  . Alcohol use: Yes  . Drug use: No  . Sexual activity: Not on file  Other Topics Concern  . Not on file  Social History Narrative  . Not on file   Social Determinants of Health   Financial Resource Strain:   . Difficulty of Paying Living Expenses: Not on file  Food Insecurity:   . Worried About Charity fundraiser in the Last Year: Not on file  . Ran Out of Food in the Last Year: Not on file  Transportation Needs:   . Lack of Transportation (Medical): Not on file  . Lack of Transportation (Non-Medical): Not on file  Physical Activity:   . Days  of Exercise per Week: Not on file  . Minutes of Exercise per Session: Not on file  Stress:   . Feeling of Stress : Not on file  Social Connections:   . Frequency of Communication with Friends and Family: Not on file  . Frequency of Social Gatherings with Friends and Family: Not on file  . Attends Religious Services: Not on file  . Active Member of Clubs or Organizations: Not on file  . Attends Archivist Meetings: Not on file  . Marital Status: Not on file   Outpatient Medications Prior to Visit  Medication Sig  . Multiple Vitamins-Minerals (MULTIVITAMIN GUMMIES  WOMENS PO) Take 2 tablets by mouth at bedtime.  Marland Kitchen ibuprofen (ADVIL,MOTRIN) 800 MG tablet Take 1 tablet (800 mg total) by mouth every 8 (eight) hours as needed. (Patient not taking: Reported on 10/12/2019)  . oxyCODONE-acetaminophen (PERCOCET/ROXICET) 5-325 MG tablet Take 1-2 tablets by mouth every 4 (four) hours as needed (moderate to severe pain (when tolerating fluids)). (Patient not taking: Reported on 10/12/2019)  . [DISCONTINUED] ondansetron (ZOFRAN) 4 MG tablet Take 1 tablet (4 mg total) by mouth every 6 (six) hours as needed for nausea. (Patient not taking: Reported on 10/12/2019)   No facility-administered medications prior to visit.   No Known Allergies  Immunization History  Administered Date(s) Administered  . PFIZER SARS-COV-2 Vaccination 04/28/2019, 05/24/2019    Health Maintenance  Topic Date Due  . INFLUENZA VACCINE  04/19/2020 (Originally 08/21/2019)  . TETANUS/TDAP  10/11/2020 (Originally 01/24/1991)  . HIV Screening  10/11/2020 (Originally 01/24/1987)  . PAP SMEAR-Modifier  03/03/2021  . COVID-19 Vaccine  Completed  . Hepatitis C Screening  Completed    Patient Care Team: Aren Pryde, Kelby Aline, FNP as PCP - General (Family Medicine) Rico Junker, RN as Registered Nurse Theodore Demark, RN as Registered Nurse  Review of Systems  Constitutional: Negative.   HENT: Negative.   Eyes: Negative.   Respiratory: Negative.   Cardiovascular: Negative for chest pain, palpitations and leg swelling.  Gastrointestinal: Negative.   Genitourinary: Negative.   Musculoskeletal: Positive for myalgias. Negative for arthralgias, back pain, gait problem, joint swelling, neck pain and neck stiffness.  Skin: Negative for color change, pallor, rash and wound.       Hair loss.   Neurological: Positive for headaches. Negative for dizziness, tremors, seizures, syncope, facial asymmetry, speech difficulty, weakness, light-headedness and numbness.  Hematological: Negative.     Psychiatric/Behavioral: Negative.   All other systems reviewed and are negative.   Last CBC Lab Results  Component Value Date   WBC 13.8 (H) 07/22/2016   HGB 7.9 (L) 07/22/2016   HCT 25.7 (L) 07/22/2016   MCV 64.7 (L) 07/22/2016   MCH 20.0 (L) 07/22/2016   RDW 18.6 (H) 07/22/2016   PLT 280 29/56/2130   Last metabolic panel Lab Results  Component Value Date   GLUCOSE 97 07/22/2016   NA 133 (L) 07/22/2016   K 4.0 07/22/2016   CL 102 07/22/2016   CO2 28 07/22/2016   BUN 8 07/22/2016   CREATININE 0.70 07/22/2016   GFRNONAA >60 07/22/2016   GFRAA >60 07/22/2016   CALCIUM 9.3 07/22/2016   ANIONGAP 3 (L) 07/22/2016   Last lipids No results found for: CHOL, HDL, LDLCALC, LDLDIRECT, TRIG, CHOLHDL Last hemoglobin A1c No results found for: HGBA1C Last thyroid functions No results found for: TSH, T3TOTAL, T4TOTAL, THYROIDAB Last vitamin D No results found for: 25OHVITD2, 25OHVITD3, VD25OH Last vitamin B12 and Folate No results found for:  VITAMINB12, FOLATE    Objective    BP (!) 156/94   Pulse 88   Temp 97.9 F (36.6 C) (Oral)   Resp 16   Ht 5\' 1"  (1.549 m)   Wt 122 lb (55.3 kg)   SpO2 100%   BMI 23.05 kg/m  Physical Exam Vitals reviewed.  Constitutional:      General: She is not in acute distress.    Appearance: She is well-developed. She is not diaphoretic.     Interventions: She is not intubated. HENT:     Head: Normocephalic and atraumatic.     Right Ear: External ear normal.     Left Ear: External ear normal.     Nose: Nose normal.     Mouth/Throat:     Pharynx: No oropharyngeal exudate.  Eyes:     General: Lids are normal. No scleral icterus.       Right eye: No discharge.        Left eye: No discharge.     Conjunctiva/sclera: Conjunctivae normal.     Right eye: Right conjunctiva is not injected. No exudate or hemorrhage.    Left eye: Left conjunctiva is not injected. No exudate or hemorrhage.    Pupils: Pupils are equal, round, and reactive to  light.  Neck:     Thyroid: No thyroid mass or thyromegaly.     Vascular: Normal carotid pulses. No carotid bruit, hepatojugular reflux or JVD.     Trachea: Trachea and phonation normal. No tracheal tenderness or tracheal deviation.     Meningeal: Brudzinski's sign and Kernig's sign absent.  Cardiovascular:     Rate and Rhythm: Normal rate and regular rhythm.     Pulses: Normal pulses.          Radial pulses are 2+ on the right side and 2+ on the left side.       Dorsalis pedis pulses are 2+ on the right side and 2+ on the left side.       Posterior tibial pulses are 2+ on the right side and 2+ on the left side.     Heart sounds: Normal heart sounds, S1 normal and S2 normal. Heart sounds not distant. No murmur heard.  No friction rub. No gallop.   Pulmonary:     Effort: Pulmonary effort is normal. No tachypnea, bradypnea, accessory muscle usage or respiratory distress. She is not intubated.     Breath sounds: Normal breath sounds. No stridor. No wheezing or rales.  Chest:     Chest wall: No tenderness.  Abdominal:     General: Bowel sounds are normal. There is no distension or abdominal bruit.     Palpations: Abdomen is soft. There is no shifting dullness, fluid wave, hepatomegaly, splenomegaly, mass or pulsatile mass.     Tenderness: There is no abdominal tenderness. There is no guarding or rebound.     Hernia: No hernia is present.  Genitourinary:    Comments: deferred to gynecology.she goes regularly she reports.  Musculoskeletal:        General: No swelling, tenderness, deformity or signs of injury. Normal range of motion.     Cervical back: Full passive range of motion without pain, normal range of motion and neck supple. No edema, erythema or rigidity. No spinous process tenderness or muscular tenderness. Normal range of motion.     Right lower leg: No edema.     Left lower leg: No edema.  Lymphadenopathy:     Head:  Right side of head: No submental, submandibular, tonsillar,  preauricular, posterior auricular or occipital adenopathy.     Left side of head: No submental, submandibular, tonsillar, preauricular, posterior auricular or occipital adenopathy.     Cervical: No cervical adenopathy.     Right cervical: No superficial, deep or posterior cervical adenopathy.    Left cervical: No superficial, deep or posterior cervical adenopathy.     Upper Body:     Right upper body: No supraclavicular or pectoral adenopathy.     Left upper body: No supraclavicular or pectoral adenopathy.  Skin:    General: Skin is warm and dry.     Capillary Refill: Capillary refill takes less than 2 seconds.     Coloration: Skin is not jaundiced or pale.     Findings: No abrasion, bruising, burn, ecchymosis, erythema, lesion, petechiae or rash.     Nails: There is no clubbing.  Neurological:     Mental Status: She is alert and oriented to person, place, and time.     GCS: GCS eye subscore is 4. GCS verbal subscore is 5. GCS motor subscore is 6.     Cranial Nerves: No cranial nerve deficit.     Sensory: No sensory deficit.     Motor: No tremor, atrophy, abnormal muscle tone or seizure activity.     Coordination: Coordination normal.     Gait: Gait normal.     Deep Tendon Reflexes: Reflexes are normal and symmetric. Reflexes normal. Babinski sign absent on the right side. Babinski sign absent on the left side.     Reflex Scores:      Tricep reflexes are 2+ on the right side and 2+ on the left side.      Bicep reflexes are 2+ on the right side and 2+ on the left side.      Brachioradialis reflexes are 2+ on the right side and 2+ on the left side.      Patellar reflexes are 2+ on the right side and 2+ on the left side.      Achilles reflexes are 2+ on the right side and 2+ on the left side. Psychiatric:        Mood and Affect: Mood normal.        Speech: Speech normal.        Behavior: Behavior normal.        Thought Content: Thought content normal.        Judgment: Judgment normal.       Depression Screen PHQ 2/9 Scores 10/12/2019  PHQ - 2 Score 0  PHQ- 9 Score 0   Results for orders placed or performed in visit on 10/12/19  POCT urinalysis dipstick  Result Value Ref Range   Color, UA yellow    Clarity, UA cloudy    Glucose, UA Negative Negative   Bilirubin, UA small    Ketones, UA trace    Spec Grav, UA >=1.030 (A) 1.010 - 1.025   Blood, UA large    pH, UA 5.0 5.0 - 8.0   Protein, UA Positive (A) Negative   Urobilinogen, UA 0.2 0.2 or 1.0 E.U./dL   Nitrite, UA negative    Leukocytes, UA Negative Negative   Appearance     Odor      Assessment & Plan      Routine health maintenance  Screening mammogram, encounter for - Plan: MM Digital Screening  Screening for blood or protein in urine - Plan: POCT urinalysis dipstick  Need for hepatitis  C screening test - Plan: Hepatitis C Antibody  History of partial hysterectomy  Hypertension, unspecified type - Plan: CBC with Differential/Platelet, Comprehensive metabolic panel, Lipid panel, TSH  Hair loss - Plan: VITAMIN D 25 Hydroxy (Vit-D Deficiency, Fractures), B12 and Folate Panel  Hematuria, unspecified type- present on POCT 10/12/19 - Plan: Urine Culture, Urine Microscopic    Meds ordered this encounter  Medications  . hydrochlorothiazide (HYDRODIURIL) 25 MG tablet    Sig: Take 1 tablet (25 mg total) by mouth daily.    Dispense:  90 tablet    Refill:  0  Start with 1/2 tablet 12.5mg  by mouth once daily x 1 week, if blood pressure remains elevated per parameters given increase to 25 mg by mouth once daily.  Keep blood pressure log. Monitor for signs of hypotension as discussed.   Incidental hematuria on POCT urine, will send for microscopic and urine culture she has no symptoms, no uterus, no recent intercourse.   Labs today  Orders Placed This Encounter  Procedures  . Urine Culture  . MM Digital Screening  . CBC with Differential/Platelet  . Comprehensive metabolic panel  . Lipid panel   . TSH  . Hepatitis C Antibody  . VITAMIN D 25 Hydroxy (Vit-D Deficiency, Fractures)  . B12 and Folate Panel  . Urine Microscopic  . POCT urinalysis dipstick   Call to schedule mammogram.  She will let office know if she desires cologuard or colonoscopy order.  Declined TDAP today she feels she is up to date and will check records at home.     Advised patient call the office or your primary care doctor for an appointment if no improvement within 72 hours or if any symptoms change or worsen at any time  Advised ER or urgent Care if after hours or on weekend. Call 911 for emergency symptoms at any time.Patinet verbalized understanding of all instructions given/reviewed and treatment plan and has no further questions or concerns at this time.     Return in about 1 month (around 11/11/2019), or if symptoms worsen or fail to improve, for at any time for any worsening symptoms, Go to Emergency room/ urgent care if worse.     Marcille Buffy, [place] [phone] (phone) [phone] (fax)  [place]

## 2019-10-13 ENCOUNTER — Telehealth: Payer: Self-pay

## 2019-10-13 LAB — LIPID PANEL
Chol/HDL Ratio: 3.4 ratio (ref 0.0–4.4)
Cholesterol, Total: 269 mg/dL — ABNORMAL HIGH (ref 100–199)
HDL: 78 mg/dL (ref >39)
LDL Chol Calc (NIH): 177 mg/dL — ABNORMAL HIGH (ref 0–99)
Triglycerides: 83 mg/dL (ref 0–149)
VLDL Cholesterol Cal: 14 mg/dL (ref 5–40)

## 2019-10-13 LAB — COMPREHENSIVE METABOLIC PANEL
ALT: 21 IU/L (ref 0–32)
AST: 21 IU/L (ref 0–40)
Albumin/Globulin Ratio: 1.9 (ref 1.2–2.2)
Albumin: 5 g/dL — ABNORMAL HIGH (ref 3.8–4.8)
Alkaline Phosphatase: 128 IU/L — ABNORMAL HIGH (ref 44–121)
BUN/Creatinine Ratio: 16 (ref 9–23)
BUN: 14 mg/dL (ref 6–24)
Bilirubin Total: 0.6 mg/dL (ref 0.0–1.2)
CO2: 23 mmol/L (ref 20–29)
Calcium: 10 mg/dL (ref 8.7–10.2)
Chloride: 100 mmol/L (ref 96–106)
Creatinine, Ser: 0.88 mg/dL (ref 0.57–1.00)
GFR calc Af Amer: 90 mL/min/{1.73_m2} (ref >59)
GFR calc non Af Amer: 78 mL/min/{1.73_m2} (ref >59)
Globulin, Total: 2.6 g/dL (ref 1.5–4.5)
Glucose: 95 mg/dL (ref 65–99)
Potassium: 4 mmol/L (ref 3.5–5.2)
Sodium: 138 mmol/L (ref 134–144)
Total Protein: 7.6 g/dL (ref 6.0–8.5)

## 2019-10-13 LAB — CBC WITH DIFFERENTIAL/PLATELET
Basophils Absolute: 0 10*3/uL (ref 0.0–0.2)
Basos: 0 %
EOS (ABSOLUTE): 0 10*3/uL (ref 0.0–0.4)
Eos: 0 %
Hematocrit: 48 % — ABNORMAL HIGH (ref 34.0–46.6)
Hemoglobin: 15.8 g/dL (ref 11.1–15.9)
Immature Grans (Abs): 0 10*3/uL (ref 0.0–0.1)
Immature Granulocytes: 0 %
Lymphocytes Absolute: 2.3 10*3/uL (ref 0.7–3.1)
Lymphs: 31 %
MCH: 29.8 pg (ref 26.6–33.0)
MCHC: 32.9 g/dL (ref 31.5–35.7)
MCV: 91 fL (ref 79–97)
Monocytes Absolute: 0.4 10*3/uL (ref 0.1–0.9)
Monocytes: 5 %
Neutrophils Absolute: 4.7 10*3/uL (ref 1.4–7.0)
Neutrophils: 64 %
Platelets: 227 10*3/uL (ref 150–450)
RBC: 5.3 x10E6/uL — ABNORMAL HIGH (ref 3.77–5.28)
RDW: 12.8 % (ref 11.7–15.4)
WBC: 7.5 10*3/uL (ref 3.4–10.8)

## 2019-10-13 LAB — HEPATITIS C ANTIBODY: Hep C Virus Ab: <0.1 s/co ratio (ref 0.0–0.9)

## 2019-10-13 LAB — TSH: TSH: 0.595 u[IU]/mL (ref 0.450–4.500)

## 2019-10-13 LAB — B12 AND FOLATE PANEL
Folate: 16.5 ng/mL (ref >3.0)
Vitamin B-12: 744 pg/mL (ref 232–1245)

## 2019-10-13 LAB — VITAMIN D 25 HYDROXY (VIT D DEFICIENCY, FRACTURES): Vit D, 25-Hydroxy: 23.2 ng/mL — ABNORMAL LOW (ref 30.0–100.0)

## 2019-10-13 NOTE — Telephone Encounter (Signed)
Copied from [place] [phone]. Topic: General - Other >> Oct 13, 2019  7:46 AM Hinda Lenis D wrote: Pt need a call back from a nurse team to go over her lab results, she's has questions and concerns / please advise

## 2019-10-13 NOTE — Telephone Encounter (Signed)
Please review labs and advise. KW

## 2019-10-13 NOTE — Progress Notes (Signed)
CBC is within normal limits and improved from 3 years ago.  CMP shows alkaline phosphatase liver enzyme mildly elevated, ALT and AST are within normal limits - ALT likely benign recommend CMP recheck in 2 months advised. ( please add lab)  Total cholesterol and LDL elevated.  Discuss lifestyle modification with patient e.g. increase exercise, fiber, fruits, vegetables, lean meat, and omega 3/fish intake and decrease saturated fat.  If patient following strict diet and exercise program already please schedule follow up appointment with primary care physician TSH for thyroid within normal limits.   Vitamin D is low, can do prescription Vitamin D at 50,000 international units by mouth ONCE every 7 days ( once weekly) for 10 weeks, recheck labs vitamin /CMP when completed vitamin D would be great.

## 2019-10-13 NOTE — Telephone Encounter (Signed)
Resulted- kindly remind patient results come to [facility] automatic now but to allow 72 hours for result note.

## 2019-10-14 ENCOUNTER — Encounter: Payer: Self-pay | Admitting: Adult Health

## 2019-10-14 LAB — URINALYSIS, MICROSCOPIC ONLY: Casts: NONE SEEN /lpf

## 2019-10-14 NOTE — Progress Notes (Signed)
Bacteria and blood on microscopic. Awaiting urine culture. Recheck at next office visit and sooner if needed. POCT urine.

## 2019-10-15 LAB — URINE CULTURE

## 2019-10-17 ENCOUNTER — Encounter: Payer: Self-pay | Admitting: Adult Health

## 2019-10-20 ENCOUNTER — Encounter: Payer: Self-pay | Admitting: Adult Health

## 2019-10-20 ENCOUNTER — Telehealth: Payer: Self-pay

## 2019-10-20 ENCOUNTER — Other Ambulatory Visit: Payer: Self-pay | Admitting: *Deleted

## 2019-10-20 DIAGNOSIS — E559 Vitamin D deficiency, unspecified: Secondary | ICD-10-CM

## 2019-10-20 MED ORDER — VITAMIN D (ERGOCALCIFEROL) 1.25 MG (50000 UNIT) PO CAPS: 50000.0000 [IU] | ORAL_CAPSULE | ORAL | 0 refills | Status: AC

## 2019-10-20 NOTE — Telephone Encounter (Signed)
Could be that that was the source of the back pain.  Would recommend follow-up with Sharyn Lull in follow-up urinalysis and microscopic urinalysis

## 2019-10-20 NOTE — Telephone Encounter (Signed)
Patient was advised and states that she will contact office back to schedule appt if symptoms do not improve. KW

## 2019-10-20 NOTE — Telephone Encounter (Signed)
Called patient in response to her mycghart message inquiring about her lab results. I advised patient of Michelles note and patient reported she had concerns about her urine microscopic showing RBC, patient states that she no longer has menstrual cycles and states that he has not experienced in pain or frequency with urination. Patient states that the day of visit she had complained of lower back pain and states that pain is still present patient is wanting to know could this be the cause of RBC being seen on urine microscopic? KW

## 2019-11-11 ENCOUNTER — Encounter: Payer: Self-pay | Admitting: Adult Health

## 2019-11-11 ENCOUNTER — Other Ambulatory Visit: Payer: Self-pay

## 2019-11-11 ENCOUNTER — Ambulatory Visit (INDEPENDENT_AMBULATORY_CARE_PROVIDER_SITE_OTHER): Payer: Self-pay | Admitting: Adult Health

## 2019-11-11 VITALS — BP 139/51 | HR 83 | Temp 97.8°F | Resp 16 | Wt 120.8 lb

## 2019-11-11 DIAGNOSIS — R319 Hematuria, unspecified: Secondary | ICD-10-CM

## 2019-11-11 DIAGNOSIS — I10 Essential (primary) hypertension: Secondary | ICD-10-CM

## 2019-11-11 DIAGNOSIS — Z90711 Acquired absence of uterus with remaining cervical stump: Secondary | ICD-10-CM

## 2019-11-11 DIAGNOSIS — M5441 Lumbago with sciatica, right side: Secondary | ICD-10-CM

## 2019-11-11 DIAGNOSIS — M545 Low back pain, unspecified: Secondary | ICD-10-CM

## 2019-11-11 LAB — POCT URINALYSIS DIPSTICK
Bilirubin, UA: NEGATIVE
Glucose, UA: NEGATIVE
Leukocytes, UA: NEGATIVE
Nitrite, UA: NEGATIVE
Protein, UA: POSITIVE — AB
Spec Grav, UA: >=1.03 — AB (ref 1.010–1.025)
Urobilinogen, UA: 0.2 E.U./dL
pH, UA: 5 (ref 5.0–8.0)

## 2019-11-11 MED ORDER — CYCLOBENZAPRINE HCL 10 MG PO TABS: 10.0000 mg | ORAL_TABLET | Freq: Every day | ORAL | 0 refills | Status: AC

## 2019-11-11 NOTE — Progress Notes (Signed)
Established patient visit   Patient: Linda Frazier   DOB: [birth year]   49 y.o. Female  MRN: [id number] Visit Date: 11/11/2019  Today's healthcare provider: Marcille Buffy, FNP   Chief Complaint  Patient presents with  . Hypertension   Subjective    HPI  Hypertension, follow-up  BP Readings from Last 3 Encounters:  11/11/19 (!) 139/51  10/12/19 (!) 156/94  07/22/16 (!) 103/58   Wt Readings from Last 3 Encounters:  11/11/19 120 lb 12.8 oz (54.8 kg)  10/12/19 122 lb (55.3 kg)  07/17/16 115 lb (52.2 kg)     She was last seen for hypertension 1 months ago.  BP at that visit was 156/94. Management since that visit includes start HCTZ 12.5mg , labs were ordered. She increased to 25mg  HCTZ after 1st 7 days.  She reports fair compliance with treatment. She is not having side effects. Patient reports no side effects but states that she stopped medication 3 days ago due to drop in blood pressure readings. Low readings at home 130's over 50's.   She also has pain in right leg for a few weeks. Night time is more intense. She was lifting heavy things at her jobs, denies numbness but denies anr erythema. Denies any rash. She has not tried to take anything for this as it is minimal.  She has some lower back pain,  She is following a Regular diet. She is exercising. She does not smoke.  Use of agents associated with hypertension: none.   Outside blood pressures are being checked periodically. Symptoms: No chest pain No chest pressure  Yes palpitations No syncope  No dyspnea No orthopnea  No paroxysmal nocturnal dyspnea No lower extremity edema   Pertinent labs: Lab Results  Component Value Date   CHOL 269 (H) 10/12/2019   HDL 78 10/12/2019   LDLCALC 177 (H) 10/12/2019   TRIG 83 10/12/2019   CHOLHDL 3.4 10/12/2019   Lab Results  Component Value Date   NA 138 10/12/2019   K 4.0 10/12/2019   CREATININE 0.88 10/12/2019   GFRNONAA 78 10/12/2019   GFRAA 90 10/12/2019    GLUCOSE 95 10/12/2019     The 10-year ASCVD risk score Mikey Bussing DC Jr., et al., 2013) is: 1.4%   ---------------------------------------------------------------------------------------------------  Patient Active Problem List   Diagnosis Date Noted  . Acute bilateral low back pain with right-sided sciatica 11/13/2019  . History of partial hysterectomy 10/12/2019  . Need for hepatitis C screening test 10/12/2019  . Routine health maintenance 10/12/2019  . Hypertension 10/12/2019  . Hair loss 10/12/2019  . Hematuria 10/12/2019  . Endometriosis 08/05/2016  . Postoperative state 07/21/2016   Past Medical History:  Diagnosis Date  . Medical history non-contributory    No Known Allergies     Medications: Outpatient Medications Prior to Visit  Medication Sig  . Multiple Vitamins-Minerals (MULTIVITAMIN GUMMIES WOMENS PO) Take 2 tablets by mouth at bedtime.  . Vitamin D, Ergocalciferol, (DRISDOL) 1.25 MG (50000 UNIT) CAPS capsule Take 1 capsule (50,000 Units total) by mouth every 7 (seven) days.  . [DISCONTINUED] hydrochlorothiazide (HYDRODIURIL) 25 MG tablet Take 1 tablet (25 mg total) by mouth daily.  . [DISCONTINUED] ibuprofen (ADVIL,MOTRIN) 800 MG tablet Take 1 tablet (800 mg total) by mouth every 8 (eight) hours as needed. (Patient not taking: Reported on 10/12/2019)  . [DISCONTINUED] oxyCODONE-acetaminophen (PERCOCET/ROXICET) 5-325 MG tablet Take 1-2 tablets by mouth every 4 (four) hours as needed (moderate to severe pain (when tolerating fluids)). (Patient not  taking: Reported on 10/12/2019)   No facility-administered medications prior to visit.    Review of Systems  Constitutional: Negative.   HENT: Negative.   Respiratory: Negative.   Gastrointestinal: Negative.   Genitourinary: Negative.   Musculoskeletal: Positive for back pain and myalgias. Negative for arthralgias, gait problem, joint swelling, neck pain and neck stiffness.  Skin: Negative.  Negative for color change  and rash.  Neurological: Negative.   Hematological: Negative.   Psychiatric/Behavioral: Negative.       Objective    BP (!) 139/51   Pulse 83   Temp 97.8 F (36.6 C) (Oral)   Resp 16   Wt 120 lb 12.8 oz (54.8 kg)   SpO2 98%   BMI 22.82 kg/m    Physical Exam Constitutional:      General: She is not in acute distress.    Appearance: Normal appearance. She is not ill-appearing, toxic-appearing or diaphoretic.  HENT:     Right Ear: External ear normal.     Left Ear: External ear normal.     Nose: Nose normal. No congestion or rhinorrhea.     Mouth/Throat:     Mouth: Mucous membranes are moist.     Pharynx: No oropharyngeal exudate or posterior oropharyngeal erythema.  Eyes:     General: No scleral icterus.       Right eye: No discharge.        Left eye: No discharge.     Extraocular Movements: Extraocular movements intact.     Conjunctiva/sclera: Conjunctivae normal.     Pupils: Pupils are equal, round, and reactive to light.  Cardiovascular:     Rate and Rhythm: Normal rate and regular rhythm.     Pulses: Normal pulses.     Heart sounds: Normal heart sounds. No murmur heard.  No friction rub. No gallop.   Pulmonary:     Effort: Pulmonary effort is normal. No respiratory distress.     Breath sounds: No stridor. No wheezing, rhonchi or rales.  Chest:     Chest wall: No tenderness.  Abdominal:     General: There is no distension.     Palpations: Abdomen is soft.     Tenderness: There is no abdominal tenderness.  Musculoskeletal:        General: Normal range of motion.     Cervical back: Normal, normal range of motion and neck supple.     Thoracic back: Normal.     Lumbar back: Spasms and tenderness present. No swelling, edema, deformity, signs of trauma, lacerations or bony tenderness. Normal range of motion. Negative right straight leg raise test and negative left straight leg raise test. No scoliosis.       Back:     Comments: Bilateral lumbosacral spasms.     Skin:    General: Skin is warm.     Capillary Refill: Capillary refill takes less than 2 seconds.     Findings: No erythema or rash.  Neurological:     Mental Status: She is oriented to person, place, and time.     Motor: No weakness.     Coordination: Coordination normal.     Gait: Gait normal.     Deep Tendon Reflexes: Reflexes normal.  Psychiatric:        Mood and Affect: Mood normal.        Behavior: Behavior normal.        Thought Content: Thought content normal.        Judgment: Judgment normal.  Results for orders placed or performed in visit on 11/11/19  Urinalysis, microscopic only  Result Value Ref Range   WBC, UA 0-5 0 - 5 /hpf   RBC 0-2 0 - 2 /hpf   Epithelial Cells (non renal) 0-10 0 - 10 /hpf   Casts None seen None seen /lpf   Crystals Present (A) N/A   Crystal Type Calcium Oxalate N/A   Bacteria, UA Few None seen/Few  POCT urinalysis dipstick  Result Value Ref Range   Color, UA yellow    Clarity, UA clear    Glucose, UA Negative Negative   Bilirubin, UA negative    Ketones, UA trace    Spec Grav, UA >=1.030 (A) 1.010 - 1.025   Blood, UA non hemolyzed trace    pH, UA 5.0 5.0 - 8.0   Protein, UA Positive (A) Negative   Urobilinogen, UA 0.2 0.2 or 1.0 E.U./dL   Nitrite, UA negative    Leukocytes, UA Negative Negative   Appearance     Odor      Assessment & Plan     .1. Acute bilateral low back pain with right-sided sciatica  Meds ordered this encounter  Medications  . cyclobenzaprine (FLEXERIL) 10 MG tablet    Sig: Take 1 tablet (10 mg total) by mouth at bedtime. Will cause drowsiness    Dispense:  30 tablet    Refill:  0   - DG Lumbar Spine Complete  2. History of partial hysterectomy Noted for history.   3. Hypertension, unspecified type Doing well with diet and lifestyle changes will discontinue HCTZ for now. Continue lifestyle dietary changes. Keep log of blood pressure.   4. Hematuria, unspecified type  - Urinalysis,  microscopic only - POCT urinalysis dipstick  5. Lumbosacral pain Will try muscle relaxer. Will try heat/ ice alternation.    Red Flags discussed. The patient was given clear instructions to go to ER or return to medical center if any red flags develop, symptoms do not improve, worsen or new problems develop. They verbalized understanding.   Return in about 3 months (around 02/11/2020), or if symptoms worsen or fail to improve, for at any time for any worsening symptoms.     Marcille Buffy, [place] [phone] (phone)    [phone] (fax)  [place]

## 2019-11-11 NOTE — Patient Instructions (Signed)
Cyclobenzaprine tablets What is this medicine? CYCLOBENZAPRINE (sye kloe BEN za preen) is a muscle relaxer. It is used to treat muscle pain, spasms, and stiffness. This medicine may be used for other purposes; ask your health care provider or pharmacist if you have questions. COMMON BRAND NAME(S): Fexmid, Flexeril What should I tell my health care provider before I take this medicine? They need to know if you have any of these conditions:  heart disease, irregular heartbeat, or previous heart attack  liver disease  thyroid problem  an unusual or allergic reaction to cyclobenzaprine, tricyclic antidepressants, lactose, other medicines, foods, dyes, or preservatives  pregnant or trying to get pregnant  breast-feeding How should I use this medicine? Take this medicine by mouth with a glass of water. Follow the directions on the prescription label. If this medicine upsets your stomach, take it with food or milk. Take your medicine at regular intervals. Do not take it more often than directed. Talk to your pediatrician regarding the use of this medicine in children. Special care may be needed. Overdosage: If you think you have taken too much of this medicine contact a poison control center or emergency room at once. NOTE: This medicine is only for you. Do not share this medicine with others. What if I miss a dose? If you miss a dose, take it as soon as you can. If it is almost time for your next dose, take only that dose. Do not take double or extra doses. What may interact with this medicine? Do not take this medicine with any of the following medications:  MAOIs like Carbex, Eldepryl, Marplan, Nardil, and Parnate  narcotic medicines for cough  safinamide This medicine may also interact with the following medications:  alcohol  bupropion  antihistamines for allergy, cough and cold  certain medicines for anxiety or sleep  certain medicines for bladder problems like oxybutynin,  tolterodine  certain medicines for depression like amitriptyline, fluoxetine, sertraline  certain medicines for Parkinson's disease like benztropine, trihexyphenidyl  certain medicines for seizures like phenobarbital, primidone  certain medicines for stomach problems like dicyclomine, hyoscyamine  certain medicines for travel sickness like scopolamine  general anesthetics like halothane, isoflurane, methoxyflurane, propofol  ipratropium  local anesthetics like lidocaine, pramoxine, tetracaine  medicines that relax muscles for surgery  narcotic medicines for pain  phenothiazines like chlorpromazine, mesoridazine, prochlorperazine, thioridazine  verapamil This list may not describe all possible interactions. Give your health care provider a list of all the medicines, herbs, non-prescription drugs, or dietary supplements you use. Also tell them if you smoke, drink alcohol, or use illegal drugs. Some items may interact with your medicine. What should I watch for while using this medicine? Tell your doctor or health care professional if your symptoms do not start to get better or if they get worse. You may get drowsy or dizzy. Do not drive, use machinery, or do anything that needs mental alertness until you know how this medicine affects you. Do not stand or sit up quickly, especially if you are an older patient. This reduces the risk of dizzy or fainting spells. Alcohol may interfere with the effect of this medicine. Avoid alcoholic drinks. If you are taking another medicine that also causes drowsiness, you may have more side effects. Give your health care provider a list of all medicines you use. Your doctor will tell you how much medicine to take. Do not take more medicine than directed. Call emergency for help if you have problems breathing or unusual sleepiness.  Your mouth may get dry. Chewing sugarless gum or sucking hard candy, and drinking plenty of water may help. Contact your  doctor if the problem does not go away or is severe. What side effects may I notice from receiving this medicine? Side effects that you should report to your doctor or health care professional as soon as possible:  allergic reactions like skin rash, itching or hives, swelling of the face, lips, or tongue  breathing problems  chest pain  fast, irregular heartbeat  hallucinations  seizures  unusually weak or tired Side effects that usually do not require medical attention (report to your doctor or health care professional if they continue or are bothersome):  headache  nausea, vomiting This list may not describe all possible side effects. Call your doctor for medical advice about side effects. You may report side effects to FDA at 1-800-FDA-1088. Where should I keep my medicine? Keep out of the reach of children. Store at room temperature between 15 and 30 degrees C (59 and 86 degrees F). Keep container tightly closed. Throw away any unused medicine after the expiration date. NOTE: This sheet is a summary. It may not cover all possible information. If you have questions about this medicine, talk to your doctor, pharmacist, or health care provider.  2020 Elsevier/Gold Standard (2017-12-09 12:49:26) Muscle Pain, Adult Muscle pain (myalgia) may be mild or severe. In most cases, the pain lasts only a short time and it goes away without treatment. It is normal to feel some muscle pain after starting a workout program. Muscles that have not been used often will be sore at first. Muscle pain may also be caused by many other things, including:  Overuse or muscle strain, especially if you are not in shape. This is the most common cause of muscle pain.  Injury.  Bruises.  Viruses, such as the flu.  Infectious diseases.  A chronic condition that causes muscle tenderness, fatigue, and headache (fibromyalgia).  A condition, such as lupus, in which the body's disease-fighting system  attacks other organs in the body (autoimmune or rheumatologic diseases).  Certain drugs, including ACE inhibitors and statins. To diagnose the cause of your muscle pain, your health care provider will do a physical exam and ask questions about the pain and when it began. If you have not had muscle pain for very long, your health care provider may want to wait before doing much testing. If your muscle pain has lasted a long time, your health care provider may want to run tests right away. In some cases, this may include tests to rule out certain conditions or illnesses. Treatment for muscle pain depends on the cause. Home care is often enough to relieve muscle pain. Your health care provider may also prescribe anti-inflammatory medicine. Follow these instructions at home: Activity  If overuse is causing your muscle pain: ? Slow down your activities until the pain goes away. ? Do regular, gentle exercises if you are not usually active. ? Warm up before exercising. Stretch before and after exercising. This can help lower the risk of muscle pain.  Do not continue working out if the pain is very bad. Bad pain could mean that you have injured a muscle. Managing pain and discomfort   If directed, apply ice to the sore muscle: ? Put ice in a plastic bag. ? Place a towel between your skin and the bag. ? Leave the ice on for 20 minutes, 2-3 times a day.  You may also alternate between  applying ice and applying heat as told by your health care provider. To apply heat, use the heat source that your health care provider recommends, such as a moist heat pack or a heating pad. ? Place a towel between your skin and the heat source. ? Leave the heat on for 20-30 minutes. ? Remove the heat if your skin turns bright red. This is especially important if you are unable to feel pain, heat, or cold. You may have a greater risk of getting burned. Medicines  Take over-the-counter and prescription medicines only  as told by your health care provider.  Do not drive or use heavy machinery while taking prescription pain medicine. Contact a health care provider if:  Your muscle pain gets worse and medicines do not help.  You have muscle pain that lasts longer than 3 days.  You have a rash or fever along with muscle pain.  You have muscle pain after a tick bite.  You have muscle pain while working out, even though you are in good physical condition.  You have redness, soreness, or swelling along with muscle pain.  You have muscle pain after starting a new medicine or changing the dose of a medicine. Get help right away if:  You have trouble breathing.  You have trouble swallowing.  You have muscle pain along with a stiff neck, fever, and vomiting.  You have severe muscle weakness or cannot move part of your body. This information is not intended to replace advice given to you by your health care provider. Make sure you discuss any questions you have with your health care provider. Document Revised: 12/19/2016 Document Reviewed: 05/29/2015 Elsevier Patient Education  [place]. Hematuria, Adult Hematuria is blood in the urine. Blood may be visible in the urine, or it may be identified with a test. This condition can be caused by infections of the bladder, urethra, kidney, or prostate. Other possible causes include:  Kidney stones.  Cancer of the urinary tract.  Too much calcium in the urine.  Conditions that are passed from parent to child (inherited conditions).  Exercise that requires a lot of energy. Infections can usually be treated with medicine, and a kidney stone usually will pass through your urine. If neither of these is the cause of your hematuria, more tests may be needed to identify the cause of your symptoms. It is very important to tell your health care provider about any blood in your urine, even if it is painless or the blood stops without treatment. Blood in the  urine, when it happens and then stops and then happens again, can be a symptom of a very serious condition, including cancer. There is no pain in the initial stages of many urinary cancers. Follow these instructions at home: Medicines  Take over-the-counter and prescription medicines only as told by your health care provider.  If you were prescribed an antibiotic medicine, take it as told by your health care provider. Do not stop taking the antibiotic even if you start to feel better. Eating and drinking  Drink enough fluid to keep your urine clear or pale yellow. It is recommended that you drink 3-4 quarts (2.8-3.8 L) a day. If you have been diagnosed with an infection, it is recommended that you drink cranberry juice in addition to large amounts of water.  Avoid caffeine, tea, and carbonated beverages. These tend to irritate the bladder.  Avoid alcohol because it may irritate the prostate (men). General instructions  If you have  been diagnosed with a kidney stone, follow your health care provider's instructions about straining your urine to catch the stone.  Empty your bladder often. Avoid holding urine for long periods of time.  If you are female: ? After a bowel movement, wipe from front to back and use each piece of toilet paper only once. ? Empty your bladder before and after sex.  Pay attention to any changes in your symptoms. Tell your health care provider about any changes or any new symptoms.  It is your responsibility to get your test results. Ask your health care provider, or the department performing the test, when your results will be ready.  Keep all follow-up visits as told by your health care provider. This is important. Contact a health care provider if:  You develop back pain.  You have a fever.  You have nausea or vomiting.  Your symptoms do not improve after 3 days.  Your symptoms get worse. Get help right away if:  You develop severe vomiting and are  unable take medicine without vomiting.  You develop severe pain in your back or abdomen even though you are taking medicine.  You pass a large amount of blood in your urine.  You pass blood clots in your urine.  You feel very weak or like you might faint.  You faint. Summary  Hematuria is blood in the urine. It has many possible causes.  It is very important that you tell your health care provider about any blood in your urine, even if it is painless or the blood stops without treatment.  Take over-the-counter and prescription medicines only as told by your health care provider.  Drink enough fluid to keep your urine clear or pale yellow. This information is not intended to replace advice given to you by your health care provider. Make sure you discuss any questions you have with your health care provider. Document Revised: 06/02/2018 Document Reviewed: 02/09/2016 Elsevier Patient Education  2020 [employer].

## 2019-11-12 LAB — URINALYSIS, MICROSCOPIC ONLY: Casts: NONE SEEN /lpf

## 2019-11-13 DIAGNOSIS — M5441 Lumbago with sciatica, right side: Secondary | ICD-10-CM | POA: Insufficient documentation

## 2020-01-02 ENCOUNTER — Telehealth: Payer: Self-pay | Admitting: Adult Health

## 2020-01-02 NOTE — Telephone Encounter (Signed)
[facility] faxed refill request for the following medications:  hydrochlorothiazide (HYDRODIURIL) 25 MG tablet  Please advise.  

## 2020-01-03 NOTE — Telephone Encounter (Signed)
Yes patient was doing well on diet and lifestyle and advised to follow up if blood pressure went back up at anytime.

## 2020-01-03 NOTE — Telephone Encounter (Signed)
According to your note HCTZ was d/c 11/11/19, please review as I will deny request.KW

## 2020-01-04 ENCOUNTER — Telehealth: Payer: Self-pay | Admitting: Adult Health

## 2020-01-04 NOTE — Telephone Encounter (Signed)
[facility] faxed refill request for the following medications:  Requesting 90 day supply: hydrochlorothiazide (HYDRODIURIL) 25 MG tablet    Please advise. Thanks, [employer]

## 2020-01-04 NOTE — Telephone Encounter (Signed)
Disregard medication d.c 11/01/19 by Laverna Peace,

## 2020-02-13 ENCOUNTER — Ambulatory Visit: Payer: Self-pay | Admitting: Adult Health

## 2020-02-23 ENCOUNTER — Ambulatory Visit: Payer: Self-pay | Admitting: Adult Health

## 2020-02-24 ENCOUNTER — Ambulatory Visit: Payer: Self-pay | Admitting: Adult Health

## 2020-07-29 ENCOUNTER — Emergency Department
Admission: EM | Admit: 2020-07-29 | Discharge: 2020-07-29 | Disposition: A | Discharge status: Home / Self Care | Payer: Self-pay | Attending: Emergency Medicine | Admitting: Emergency Medicine

## 2020-07-29 ENCOUNTER — Emergency Department: Payer: Self-pay

## 2020-07-29 ENCOUNTER — Other Ambulatory Visit: Payer: Self-pay

## 2020-07-29 DIAGNOSIS — Z79899 Other long term (current) drug therapy: Secondary | ICD-10-CM | POA: Insufficient documentation

## 2020-07-29 DIAGNOSIS — R42 Dizziness and giddiness: Secondary | ICD-10-CM | POA: Insufficient documentation

## 2020-07-29 DIAGNOSIS — I10 Essential (primary) hypertension: Secondary | ICD-10-CM | POA: Insufficient documentation

## 2020-07-29 LAB — CBC
HCT: 44.2 % (ref 36.0–46.0)
Hemoglobin: 15.4 g/dL — ABNORMAL HIGH (ref 12.0–15.0)
MCH: 30.5 pg (ref 26.0–34.0)
MCHC: 34.8 g/dL (ref 30.0–36.0)
MCV: 87.5 fL (ref 80.0–100.0)
Platelets: 271 10*3/uL (ref 150–400)
RBC: 5.05 MIL/uL (ref 3.87–5.11)
RDW: 12.6 % (ref 11.5–15.5)
WBC: 10.5 10*3/uL (ref 4.0–10.5)
nRBC: 0 % (ref 0.0–0.2)

## 2020-07-29 LAB — COMPREHENSIVE METABOLIC PANEL
ALT: 25 U/L (ref 0–44)
AST: 23 U/L (ref 15–41)
Albumin: 4.8 g/dL (ref 3.5–5.0)
Alkaline Phosphatase: 99 U/L (ref 38–126)
Anion gap: 7 (ref 5–15)
BUN: 15 mg/dL (ref 6–20)
CO2: 28 mmol/L (ref 22–32)
Calcium: 9.7 mg/dL (ref 8.9–10.3)
Chloride: 103 mmol/L (ref 98–111)
Creatinine, Ser: 0.78 mg/dL (ref 0.44–1.00)
GFR, Estimated: >60 mL/min (ref >60)
Glucose, Bld: 118 mg/dL — ABNORMAL HIGH (ref 70–99)
Potassium: 3.7 mmol/L (ref 3.5–5.1)
Sodium: 138 mmol/L (ref 135–145)
Total Bilirubin: 0.7 mg/dL (ref 0.3–1.2)
Total Protein: 8.4 g/dL — ABNORMAL HIGH (ref 6.5–8.1)

## 2020-07-29 LAB — LIPASE, BLOOD: Lipase: 37 U/L (ref 11–51)

## 2020-07-29 MED ORDER — MECLIZINE HCL 25 MG PO TABS: 25.0000 mg | ORAL_TABLET | Freq: Three times a day (TID) | ORAL | 0 refills | Status: AC | PRN

## 2020-07-29 MED ORDER — DIAZEPAM 2 MG PO TABS: 2.0000 mg | ORAL_TABLET | Freq: Three times a day (TID) | ORAL | 0 refills | Status: AC | PRN

## 2020-07-29 MED ORDER — ONDANSETRON 4 MG PO TBDP: 4.0000 mg | ORAL_TABLET | Freq: Three times a day (TID) | ORAL | 0 refills | Status: AC | PRN

## 2020-07-29 MED ORDER — DIAZEPAM 2 MG PO TABS
2.0000 mg | ORAL_TABLET | Freq: Once | ORAL | Status: AC
  Administered 2020-07-29: 2 mg via ORAL
  Filled 2020-07-29: qty 1

## 2020-07-29 MED ORDER — MECLIZINE HCL 25 MG PO TABS
25.0000 mg | ORAL_TABLET | Freq: Once | ORAL | Status: AC
  Administered 2020-07-29: 25 mg via ORAL
  Filled 2020-07-29: qty 1

## 2020-07-29 NOTE — ED Triage Notes (Signed)
Pt comes in for dizziness and emesis for 2 days. States room spinning. Pt has been trialed on BP meds but was recently taken off.

## 2020-07-29 NOTE — Discharge Instructions (Addendum)
Your MRI was negative.  This means with your cold symptoms you almost definitely have a mild inner ear infection that should get better fairly quickly.  The Antivert and Valium 3 times a day each should help.  Be careful they can both make you sleepy.  You should not drive on either one of them.  If you have problems with nausea and vomiting you can use the Zofran melt on your tongue wafers up to 3 times a day as well.  Please return here if you get worse symptoms or if you are not better in 6 to 7 days.  You can also follow-up with your regular doctor.

## 2020-07-29 NOTE — ED Notes (Signed)
Pt states dizziness starting 2 days ago, pt states that the room is spinning and only stops when she is lying flat. Pt told to call for assistance to bathroom. EDP in room

## 2020-07-30 NOTE — ED Provider Notes (Signed)
[facility] Emergency Department Provider Note  ____________________________________________   Event Date/Time   First MD Initiated Contact with Patient 07/29/20 1839     (approximate)  I have reviewed the triage vital signs and the nursing notes.   HISTORY  Chief Complaint Dizziness and Emesis   HPI Linda Frazier is a 50 y.o. female who reports 2 days of vertigo.  Came on initially with some vomiting but she is not vomiting any longer.  Vertigo never actually resolves but becomes much better if she does not move her head.  She has no fever numbness weakness slurry speech or other complaints.  She has not had this before.  Initially it was severe when she is not moving it is only mild.        Past Medical History:  Diagnosis Date   Medical history non-contributory     Patient Active Problem List   Diagnosis Date Noted   Acute bilateral low back pain with right-sided sciatica 11/13/2019   History of partial hysterectomy 10/12/2019   Need for hepatitis C screening test 10/12/2019   Routine health maintenance 10/12/2019   Hypertension 10/12/2019   Hair loss 10/12/2019   Hematuria 10/12/2019   Endometriosis 08/05/2016   Postoperative state 07/21/2016    Past Surgical History:  Procedure Laterality Date   BILATERAL SALPINGECTOMY Bilateral 07/21/2016   Procedure: BILATERAL SALPINGECTOMY;  Surgeon: Ouida Sills Gwen Her, MD;  Location: [facility] ORS;  Service: Gynecology;  Laterality: Bilateral;   NO PAST SURGERIES     VAGINAL HYSTERECTOMY N/A 07/21/2016   Procedure: HYSTERECTOMY VAGINAL;  Surgeon: Schermerhorn, Gwen Her, MD;  Location: [facility] ORS;  Service: Gynecology;  Laterality: N/A;    Prior to Admission medications   Medication Sig Start Date End Date Taking? Authorizing Provider  diazepam (VALIUM) 2 MG tablet Take 1 tablet (2 mg total) by mouth every 8 (eight) hours as needed for anxiety. 07/29/20 07/29/21 Yes Nena Polio, MD  meclizine (ANTIVERT)  25 MG tablet Take 1 tablet (25 mg total) by mouth 3 (three) times daily as needed for dizziness. 07/29/20  Yes Nena Polio, MD  ondansetron (ZOFRAN ODT) 4 MG disintegrating tablet Take 1 tablet (4 mg total) by mouth every 8 (eight) hours as needed for nausea or vomiting. 07/29/20  Yes Nena Polio, MD  cyclobenzaprine (FLEXERIL) 10 MG tablet Take 1 tablet (10 mg total) by mouth at bedtime. Will cause drowsiness 11/11/19   Flinchum, Kelby Aline, FNP  Multiple Vitamins-Minerals (MULTIVITAMIN GUMMIES WOMENS PO) Take 2 tablets by mouth at bedtime.    [provider]  Vitamin D, Ergocalciferol, (DRISDOL) 1.25 MG (50000 UNIT) CAPS capsule Take 1 capsule (50,000 Units total) by mouth every 7 (seven) days. 10/20/19   Flinchum, Kelby Aline, FNP    Allergies Patient has no known allergies.  Family History  Problem Relation Age of Onset   Colon cancer Maternal Grandmother    Hypertension Mother     Social History Social History   Tobacco Use   Smoking status: Never   Smokeless tobacco: Never  Vaping Use   Vaping Use: Never used  Substance Use Topics   Alcohol use: Yes   Drug use: No    Review of Systems  Constitutional: No fever/chills Eyes: No visual changes. ENT: No sore throat. Cardiovascular: Denies chest pain. Respiratory: Denies shortness of breath. Gastrointestinal: Currently no abdominal pain.  No nausea, no vomiting.  No diarrhea.  No constipation. Genitourinary: Negative for dysuria. Musculoskeletal: Negative for back pain. Skin:  Negative for rash. Neurological: Negative for headaches, focal weakness  ____________________________________________   PHYSICAL EXAM:  VITAL SIGNS: ED Triage Vitals  Enc Vitals Group     BP 07/29/20 1644 (!) 185/113     Pulse Rate 07/29/20 1644 99     Resp 07/29/20 1644 18     Temp 07/29/20 1644 97.7 F (36.5 C)     Temp Source 07/29/20 1644 Oral     SpO2 07/29/20 1644 99 %     Weight 07/29/20 1645 115 lb (52.2 kg)      Height 07/29/20 1645 5\' 1"  (1.549 m)     Head Circumference --      Peak Flow --      Pain Score 07/29/20 1647 0     Pain Loc --      Pain Edu? --      Excl. in [place]? --     Constitutional: Alert and oriented. Well appearing and in no acute distress. Eyes: Conjunctivae are normal. PERRL. EOMI. fundi also appear normal Head: Atraumatic. Nose: No congestion/rhinnorhea. Mouth/Throat: Mucous membranes are moist.  Oropharynx non-erythematous. Neck: No stridor.   Cardiovascular: Normal rate, regular rhythm. Grossly normal heart sounds.  Good peripheral circulation. Respiratory: Normal respiratory effort.  No retractions. Lungs CTAB. Gastrointestinal: Soft and nontender. No distention. No abdominal bruits. No CVA tenderness. Musculoskeletal: No lower extremity tenderness nor edema.   Neurologic:  Normal speech and language. No gross focal neurologic deficits are appreciated.  Specifically cranial nerves II through XII are intact.  Thrust testing appears to be positive with eye deviation when I turn her head to the left but not to the right.  I am unable to induce nystagmus to check for fatigability or latency. Skin:  Skin is warm, dry and intact. No rash noted.   ____________________________________________   LABS (all labs ordered are listed, but only abnormal results are displayed)  Labs Reviewed  COMPREHENSIVE METABOLIC PANEL - Abnormal; Notable for the following components:      Result Value   Glucose, Bld 118 (*)    Total Protein 8.4 (*)    All other components within normal limits  CBC - Abnormal; Notable for the following components:   Hemoglobin 15.4 (*)    All other components within normal limits  LIPASE, BLOOD  POC URINE PREG, ED   ____________________________________________  EKG  EKG read interpreted by me shows normal sinus rhythm rate of 74 normal axis no acute ST-T wave changes ____________________________________________  RADIOLOGY Gertha Calkin,  personally viewed and evaluated these images (plain radiographs) as part of my medical decision making, as well as reviewing the written report by the radiologist.  ED MD interpretation: MRI read by radiology reviewed by me shows no acute changes no stroke  Official radiology report(s): MR BRAIN WO CONTRAST  Result Date: 07/29/2020 CLINICAL DATA:  Dizziness. EXAM: MRI HEAD WITHOUT CONTRAST TECHNIQUE: Multiplanar, multiecho pulse sequences of the brain and surrounding structures were obtained without intravenous contrast. COMPARISON:  None. FINDINGS: Brain: No acute infarction, hemorrhage, hydrocephalus, extra-axial collection or mass lesion. Vascular: Major arterial flow voids are maintained skull base. Skull and upper cervical spine: Normal marrow signal. Sinuses/Orbits: Left larger than right maxillary sinus retention cyst versus polyps. Mild ethmoid air cell mucosal thickening. Otherwise, sinuses are clear. Other: No mastoid effusions. IMPRESSION: No evidence of acute intracranial abnormality. Specifically, no acute infarct. Electronically Signed   By: Margaretha Sheffield MD   On: 07/29/2020 20:16    ____________________________________________   PROCEDURES  Procedure(s) performed (including Critical Care):  Procedures   ____________________________________________   INITIAL IMPRESSION / ASSESSMENT AND PLAN / ED COURSE  Patient with history of hypertension at 50 years old is at slight slightly increased risk of stroke.  Thrust testing was indeterminate therefore elected to do an MRI.  This did not show any evidence of stroke and patient feeling better at the end of her visit.  I will let her go with the Antivert and Valium.  She will return here if she is worse and follow-up with her regular doctor.              ____________________________________________   FINAL CLINICAL IMPRESSION(S) / ED DIAGNOSES  Final diagnoses:  Vertigo     ED Discharge Orders          Ordered     meclizine (ANTIVERT) 25 MG tablet  3 times daily PRN        07/29/20 2039    diazepam (VALIUM) 2 MG tablet  Every 8 hours PRN        07/29/20 2039    ondansetron (ZOFRAN ODT) 4 MG disintegrating tablet  Every 8 hours PRN        07/29/20 2039             Note:  This document was prepared using Dragon voice recognition software and may include unintentional dictation errors.    Nena Polio, MD 07/30/20 [phone]

## 2020-07-31 ENCOUNTER — Other Ambulatory Visit (HOSPITAL_COMMUNITY): Payer: Self-pay

## 2020-08-06 ENCOUNTER — Emergency Department: Payer: Managed Care, Other (non HMO)

## 2020-08-06 ENCOUNTER — Emergency Department
Admission: EM | Admit: 2020-08-06 | Discharge: 2020-08-06 | Disposition: A | Discharge status: Home / Self Care | Payer: Managed Care, Other (non HMO) | Attending: Emergency Medicine | Admitting: Emergency Medicine

## 2020-08-06 ENCOUNTER — Other Ambulatory Visit: Payer: Self-pay

## 2020-08-06 ENCOUNTER — Ambulatory Visit: Payer: Self-pay

## 2020-08-06 DIAGNOSIS — R0981 Nasal congestion: Secondary | ICD-10-CM

## 2020-08-06 DIAGNOSIS — R0789 Other chest pain: Secondary | ICD-10-CM

## 2020-08-06 DIAGNOSIS — Z760 Encounter for issue of repeat prescription: Secondary | ICD-10-CM

## 2020-08-06 DIAGNOSIS — I10 Essential (primary) hypertension: Secondary | ICD-10-CM

## 2020-08-06 DIAGNOSIS — Z79899 Other long term (current) drug therapy: Secondary | ICD-10-CM | POA: Insufficient documentation

## 2020-08-06 DIAGNOSIS — R42 Dizziness and giddiness: Secondary | ICD-10-CM | POA: Insufficient documentation

## 2020-08-06 LAB — CBC
HCT: 41.7 % (ref 36.0–46.0)
Hemoglobin: 15 g/dL (ref 12.0–15.0)
MCH: 31.2 pg (ref 26.0–34.0)
MCHC: 36 g/dL (ref 30.0–36.0)
MCV: 86.7 fL (ref 80.0–100.0)
Platelets: 228 10*3/uL (ref 150–400)
RBC: 4.81 MIL/uL (ref 3.87–5.11)
RDW: 12.3 % (ref 11.5–15.5)
WBC: 7.9 10*3/uL (ref 4.0–10.5)
nRBC: 0 % (ref 0.0–0.2)

## 2020-08-06 LAB — BASIC METABOLIC PANEL
Anion gap: 8 (ref 5–15)
BUN: 13 mg/dL (ref 6–20)
CO2: 27 mmol/L (ref 22–32)
Calcium: 9.5 mg/dL (ref 8.9–10.3)
Chloride: 101 mmol/L (ref 98–111)
Creatinine, Ser: 0.79 mg/dL (ref 0.44–1.00)
GFR, Estimated: >60 mL/min (ref >60)
Glucose, Bld: 152 mg/dL — ABNORMAL HIGH (ref 70–99)
Potassium: 4.2 mmol/L (ref 3.5–5.1)
Sodium: 136 mmol/L (ref 135–145)

## 2020-08-06 LAB — HEPATIC FUNCTION PANEL
ALT: 27 U/L (ref 0–44)
AST: 29 U/L (ref 15–41)
Albumin: 4.3 g/dL (ref 3.5–5.0)
Alkaline Phosphatase: 97 U/L (ref 38–126)
Bilirubin, Direct: 0.1 mg/dL (ref 0.0–0.2)
Indirect Bilirubin: 0.7 mg/dL (ref 0.3–0.9)
Total Bilirubin: 0.8 mg/dL (ref 0.3–1.2)
Total Protein: 7.4 g/dL (ref 6.5–8.1)

## 2020-08-06 LAB — TROPONIN I (HIGH SENSITIVITY): Troponin I (High Sensitivity): 4 ng/L (ref <18)

## 2020-08-06 MED ORDER — HYDROCHLOROTHIAZIDE 12.5 MG PO CAPS
12.5000 mg | ORAL_CAPSULE | Freq: Every day | ORAL | Status: DC
  Administered 2020-08-06: 12.5 mg via ORAL
  Filled 2020-08-06: qty 1

## 2020-08-06 MED ORDER — HYDROCHLOROTHIAZIDE 12.5 MG PO CAPS: 12.5000 mg | ORAL_CAPSULE | Freq: Every day | ORAL | 0 refills | Status: AC

## 2020-08-06 NOTE — ED Triage Notes (Signed)
Pt comes with c/o CP and dizziness. Pt also states her Bp has been elevated and is out of meds for that.

## 2020-08-06 NOTE — Telephone Encounter (Deleted)
Pt called c/o "high BP" of 174/89         Answer Assessment - Initial Assessment Questions 1. DESCRIPTION: "Describe your dizziness."     Room spinning, vertigo 2. LIGHTHEADED: "Do you feel lightheaded?" (e.g., somewhat faint, woozy, weak upon standing)     Mild  3. VERTIGO: "Do you feel like either you or the room is spinning or tilting?" (i.e. vertigo)   Yes-room- spinning 4. SEVERITY: "How bad is it?"  "Do you feel like you are going to faint?" "Can you stand and walk?"   - MILD: Feels slightly dizzy, but walking normally.   - MODERATE: Feels unsteady when walking, but not falling; interferes with normal activities (e.g., school, work).   - SEVERE: Unable to walk without falling, or requires assistance to walk without falling; feels like passing out now.      moderate 5. ONSET:  "When did the dizziness begin?"     Last Saturday 6. AGGRAVATING FACTORS: "Does anything make it worse?" (e.g., standing, change in head position)     Standing and walking, turning head, occasionally when supine 7. HEART RATE: "Can you tell me your heart rate?" "How many beats in 15 seconds?"  (Note: not all patients can do this)     80 8. CAUSE: "What do you think is causing the dizziness?"     High blood pressure 9. RECURRENT SYMPTOM: "Have you had dizziness before?" If Yes, ask: "When was the last time?" "What happened that time?"     Yes last weekend- went to ED dx with vertigo but BP 185/113 10. OTHER SYMPTOMS: "Do you have any other symptoms?" (e.g., fever, chest pain, vomiting, diarrhea, bleeding)       Vomiting last Saturday// nausea//Elevated BP, tightness lower leg- no swelling locasted lateral  starts at calf and below calf--no warmth or redness chronic 11. PREGNANCY: "Is there any chance you are pregnant?" "When was your last menstrual period?"       S/p hysterectomy  Answer Assessment - Initial Assessment Questions 1. BLOOD PRESSURE: "What is the blood pressure?" "Did you take at least two  measurements 5 minutes apart?"     174/89 @ 0930 2. ONSET: "When did you take your blood pressure?"     See above 3. HOW: "How did you obtain the blood pressure?" (e.g., visiting nurse, automatic home BP monitor)     Automatic upper arm  4. HISTORY: "Do you have a history of high blood pressure?"     yes 5. MEDICATIONS: "Are you taking any medications for blood pressure?" "Have you missed any doses recently?"     no 6. OTHER SYMPTOMS: "Do you have any symptoms?" (e.g., headache, chest pain, blurred vision, difficulty breathing, weakness)     Headache (sinus pressure)facial pain, poor appetite from feeling dizzy makes nauseated.  Protocols used: Dizziness - Lightheadedness-A-AH, Blood Pressure - High-A-AH

## 2020-08-06 NOTE — ED Notes (Addendum)
See triage note. Pt reports has been nauseous and dizzy at times with chest discomfort over the last week. States primary doctor no longer available and hasn't been able to get into a new one yet to refill prescription. Denies slurred speech, blurred vision, and major headaches. Resp reg/unlabored; skin dry; resting calmly on stretcher.

## 2020-08-06 NOTE — ED Notes (Signed)
Pt refused covid swab. °

## 2020-08-06 NOTE — ED Notes (Signed)
Called lab to add on HFP. Elmyra Ricks states they'll add it on now.

## 2020-08-06 NOTE — ED Provider Notes (Signed)
[facility] Emergency Department Provider Note  ____________________________________________   Event Date/Time   First MD Initiated Contact with Patient 08/06/20 1134     (approximate)  I have reviewed the triage vital signs and the nursing notes.   HISTORY  Chief Complaint Chest Pain   HPI Linda Frazier is a 50 y.o. female with past medical history of high blood pressure not currently on antihypertensives who presents for assessment of what a week of some nausea associate with intermittent lightheadedness, intermittent vertigo precipitated by movement and a little bit of chest discomfort.  Patient states he does not feel pain per se in her chest but thinks it may be from the nausea she has had on and off for the last week.  She did vomit a week ago but has not had any vomiting since then.  She has no diarrhea, urinary symptoms, abdominal pain, back pain, rash or extremity pain.  She denies any vision changes, earache, sore throat, fevers, chills, focal extremity weakness numbness or tingling or other clear acute complaints.  She has she was seen about a week ago on 7/10 and at that time had more severe Rheumacin sensation and was given some meclizine which she thinks is helped a little bit but still has a little bit of nausea lightheadedness and some chest discomfort and notes her blood pressure has been quite high.  She is not sure what bladder mesh she she has been on the past.  She denies any EtOH or illicit drug use.         Past Medical History:  Diagnosis Date   Medical history non-contributory     Patient Active Problem List   Diagnosis Date Noted   Acute bilateral low back pain with right-sided sciatica 11/13/2019   History of partial hysterectomy 10/12/2019   Need for hepatitis C screening test 10/12/2019   Routine health maintenance 10/12/2019   Hypertension 10/12/2019   Hair loss 10/12/2019   Hematuria 10/12/2019   Endometriosis 08/05/2016    Postoperative state 07/21/2016    Past Surgical History:  Procedure Laterality Date   BILATERAL SALPINGECTOMY Bilateral 07/21/2016   Procedure: BILATERAL SALPINGECTOMY;  Surgeon: Ouida Sills Gwen Her, MD;  Location: [facility] ORS;  Service: Gynecology;  Laterality: Bilateral;   NO PAST SURGERIES     VAGINAL HYSTERECTOMY N/A 07/21/2016   Procedure: HYSTERECTOMY VAGINAL;  Surgeon: Schermerhorn, Gwen Her, MD;  Location: [facility] ORS;  Service: Gynecology;  Laterality: N/A;    Prior to Admission medications   Medication Sig Start Date End Date Taking? Authorizing Provider  hydrochlorothiazide (MICROZIDE) 12.5 MG capsule Take 1 capsule (12.5 mg total) by mouth daily. 08/06/20 09/05/20 Yes Lucrezia Starch, MD  cyclobenzaprine (FLEXERIL) 10 MG tablet Take 1 tablet (10 mg total) by mouth at bedtime. Will cause drowsiness 11/11/19   Flinchum, Kelby Aline, FNP  diazepam (VALIUM) 2 MG tablet Take 1 tablet (2 mg total) by mouth every 8 (eight) hours as needed for anxiety. 07/29/20 07/29/21  Nena Polio, MD  meclizine (ANTIVERT) 25 MG tablet Take 1 tablet (25 mg total) by mouth 3 (three) times daily as needed for dizziness. 07/29/20   Nena Polio, MD  Multiple Vitamins-Minerals (MULTIVITAMIN GUMMIES WOMENS PO) Take 2 tablets by mouth at bedtime.    [provider]  ondansetron (ZOFRAN ODT) 4 MG disintegrating tablet Take 1 tablet (4 mg total) by mouth every 8 (eight) hours as needed for nausea or vomiting. 07/29/20   Nena Polio, MD  Vitamin  D, Ergocalciferol, (DRISDOL) 1.25 MG (50000 UNIT) CAPS capsule Take 1 capsule (50,000 Units total) by mouth every 7 (seven) days. 10/20/19   Flinchum, Kelby Aline, FNP    Allergies Patient has no known allergies.  Family History  Problem Relation Age of Onset   Colon cancer Maternal Grandmother    Hypertension Mother     Social History Social History   Tobacco Use   Smoking status: Never   Smokeless tobacco: Never  Vaping Use   Vaping Use: Never used   Substance Use Topics   Alcohol use: Yes   Drug use: No    Review of Systems  Review of Systems  Constitutional:  Negative for chills and fever.  HENT:  Positive for congestion and sinus pain (intermittent fullness in frontal sinuses). Negative for sore throat.   Eyes:  Negative for pain.  Respiratory:  Negative for cough and stridor.   Cardiovascular:  Positive for chest pain.  Gastrointestinal:  Positive for nausea. Negative for vomiting.  Genitourinary:  Negative for dysuria.  Musculoskeletal:  Negative for myalgias.  Skin:  Negative for rash.  Neurological:  Positive for dizziness. Negative for seizures, loss of consciousness and headaches.  Psychiatric/Behavioral:  Negative for suicidal ideas.   All other systems reviewed and are negative.    ____________________________________________   PHYSICAL EXAM:  VITAL SIGNS: ED Triage Vitals  Enc Vitals Group     BP 08/06/20 1113 (!) 191/97     Pulse Rate 08/06/20 1113 76     Resp 08/06/20 1113 18     Temp 08/06/20 1113 98 F (36.7 C)     Temp src --      SpO2 08/06/20 1113 97 %     Weight --      Height --      Head Circumference --      Peak Flow --      Pain Score 08/06/20 1114 0     Pain Loc --      Pain Edu? --      Excl. in [place]? --    Vitals:   08/06/20 1224 08/06/20 1300  BP: (!) 161/84 (!) 163/85  Pulse: 71 70  Resp:    Temp:    SpO2: 99% 100%   Physical Exam Vitals and nursing note reviewed.  Constitutional:      General: She is not in acute distress.    Appearance: She is well-developed.  HENT:     Head: Normocephalic and atraumatic.     Right Ear: External ear normal.     Left Ear: External ear normal.     Nose: Nose normal.     Mouth/Throat:     Mouth: Mucous membranes are moist.  Eyes:     Conjunctiva/sclera: Conjunctivae normal.  Cardiovascular:     Rate and Rhythm: Normal rate and regular rhythm.     Heart sounds: No murmur heard. Pulmonary:     Effort: Pulmonary effort is normal. No  respiratory distress.     Breath sounds: Normal breath sounds.  Abdominal:     Palpations: Abdomen is soft.     Tenderness: There is no abdominal tenderness.  Musculoskeletal:     Cervical back: Neck supple.  Skin:    General: Skin is warm and dry.     Capillary Refill: Capillary refill takes less than 2 seconds.  Neurological:     Mental Status: She is alert and oriented to person, place, and time.  Psychiatric:  Mood and Affect: Mood normal.    Cranial nerves II through XII grossly intact.  No pronator drift.  No finger dysmetria.  Symmetric 5/5 strength of all extremities.  Sensation intact to light touch in all extremities.  Unremarkable unassisted gait.  ____________________________________________   LABS (all labs ordered are listed, but only abnormal results are displayed)  Labs Reviewed  BASIC METABOLIC PANEL - Abnormal; Notable for the following components:      Result Value   Glucose, Bld 152 (*)    All other components within normal limits  RESP PANEL BY RT-PCR (FLU A&B, COVID) ARPGX2  CBC  HEPATIC FUNCTION PANEL  POC URINE PREG, ED  TROPONIN I (HIGH SENSITIVITY)  TROPONIN I (HIGH SENSITIVITY)   ____________________________________________  EKG  Sinus rhythm with a ventricular rate of 70, normal axis, unremarkable intervals without clearance of acute ischemia or significant arrhythmia. ____________________________________________  RADIOLOGY  ED MD interpretation: No focal consolidation, large effusion, significant edema, pneumothorax or other clear acute intrathoracic process.  Official radiology report(s): DG Chest 2 View  Result Date: 08/06/2020 CLINICAL DATA:  Chest pain EXAM: CHEST - 2 VIEW COMPARISON:  09/01/2012 FINDINGS: The heart size and mediastinal contours are within normal limits. Both lungs are clear. The visualized skeletal structures are unremarkable. IMPRESSION: No active cardiopulmonary disease. Electronically Signed   By: Inez Catalina  M.D.   On: 08/06/2020 12:13    ____________________________________________   PROCEDURES  Procedure(s) performed (including Critical Care):  Procedures   ____________________________________________   INITIAL IMPRESSION / ASSESSMENT AND PLAN / ED COURSE      Patient presents with above-stated history exam for assessment of some nausea, lightheadedness, some mild chest discomfort with improved vertigo on the meclizine she has been taking.  On arrival she is hypertensive with a BP of 191/97.  She states has not taken her BP meds in several months.  With regard to her dizziness seems primarily lightheaded she states that a sensation of vertigo which previously had been more noticeable component and precipitated by movement and improved with meclizine.  I suspect this is likely peripheral.  She denies any headache vision changes and has no findings on exam i.e. nystagmus or ataxic gait to suggest CVA.  It is certainly possible she is experiencing some symptoms related to high blood pressure so we will give a dose of home BP meds to treat possible hypertensive urgency.  He does also note she is at some congestion and some frontal pressure for a while and is certainly possible she is having some chronic sinusitis although do not see other evidence of deep space infection in the head or neck.  Likely allergies no lower suspicion for acute bacterial sinusitis at this time.  Discussed using over-the-counter allergy medicines with avoiding phenylephrine containing products.  She has no headache and overall I have a low suspicion for [facility].  With regard to her nausea and chest discomfort certainly possible is also related to some hypertensive urgency and symptoms related to her blood pressure.  She has no fever or history of cough or abnormal breath sounds or findings on chest x-ray to suggest pneumonia.  ECG and nonelevated troponin are not suggestive of ACS or arrhythmia or myocarditis.  Low suspicion for  PE as patient is PERC negative.  She was given a dose of hydrochlorothiazide which were taken in the past on reassessment states he felt little better and her BP was noted 160/85.  Given stable vitals with eyes reassuring exam and work-up and improvement  in blood pressure with low suspicion for immediate life-threatening process think patient stable for close outpatient follow-up.  Given history of congestion and some nausea we will also send a COVID influenza swab I advised patient she can follow-up these results online.  Advised her to follow-up with PCP in 7 to 10 days.  Refill for hydrochlorothiazide provided.  Patient states she does not feel she needs any more meclizine at this point practically helped very much.  Discharged stable condition.  Strict return cautions advised and he has     ____________________________________________   FINAL CLINICAL IMPRESSION(S) / ED DIAGNOSES  Final diagnoses:  Dizzy  Hypertension, unspecified type  Head congestion  Chest discomfort  Medication refill    Medications  hydrochlorothiazide (MICROZIDE) capsule 12.5 mg (12.5 mg Oral Given 08/06/20 1226)     ED Discharge Orders          Ordered    hydrochlorothiazide (MICROZIDE) 12.5 MG capsule  Daily        08/06/20 1351             Note:  This document was prepared using Dragon voice recognition software and may include unintentional dictation errors.    Lucrezia Starch, MD 08/06/20 [phone]

## 2020-08-06 NOTE — Telephone Encounter (Addendum)
Pt c/o dizziness and vertigo. Was seen at ED last Saturday and was given RX for dizziness. Pt is walking but occasionally has to hold on to wall to maintain balance. Pt denies weakness or numbness to either side of face , arms or legs. Pt stated she is drinking well.  Pt also mentioned a chronic issue of "tightness" to her right lower leg in the calf area. Pt denies any warmth or redness or pain. Denies chest pain or SOB. Care advice given and pt verbalized understanding. Called Elena at [facility] and no appts available . Advised pt to go to UC.

## 2020-08-09 ENCOUNTER — Encounter: Payer: Self-pay | Admitting: Family Medicine

## 2020-08-16 ENCOUNTER — Encounter: Payer: Self-pay | Admitting: Family Medicine

## 2020-08-16 ENCOUNTER — Other Ambulatory Visit: Payer: Self-pay

## 2020-08-16 ENCOUNTER — Ambulatory Visit: Payer: Self-pay | Admitting: Family Medicine

## 2020-08-16 VITALS — BP 164/84 | HR 74 | Temp 97.9°F | Resp 16 | Wt 120.9 lb

## 2020-08-16 DIAGNOSIS — K219 Gastro-esophageal reflux disease without esophagitis: Secondary | ICD-10-CM | POA: Insufficient documentation

## 2020-08-16 DIAGNOSIS — I10 Essential (primary) hypertension: Secondary | ICD-10-CM

## 2020-08-16 DIAGNOSIS — R079 Chest pain, unspecified: Secondary | ICD-10-CM

## 2020-08-16 MED ORDER — PANTOPRAZOLE SODIUM 40 MG PO TBEC: 40.0000 mg | DELAYED_RELEASE_TABLET | Freq: Every day | ORAL | 0 refills | Status: DC

## 2020-08-16 MED ORDER — HYDROCHLOROTHIAZIDE 25 MG PO TABS: 25.0000 mg | ORAL_TABLET | Freq: Every day | ORAL | 3 refills | Status: AC

## 2020-08-16 NOTE — Assessment & Plan Note (Signed)
Central epigastric pain, almost always present Rates 1-2/10 for pain Does not change with activity, food choice or stress Pt has reduced caffeine intake since previous ER visit Started on PPI, will eval in 2 wks for s/s improvement

## 2020-08-16 NOTE — Progress Notes (Signed)
Established patient visit   Patient: Linda Frazier   DOB: 09-Nov-1970   50 y.o. Female  MRN: [id number] Visit Date: 08/16/2020  Today's healthcare provider: Gwyneth Sprout, FNP   Chief Complaint  Patient presents with   Follow-up   Subjective    Pt recently seen within inpatient setting- was started on oral blood pressure treatment   Follow up ER visit  Patient was seen in ER for chest pain on 08/06/20. She was treated for Hypertension. Treatment for this included started patient on HCTZ 12.'5mg'$ . She reports excellent compliance with treatment. She reports this condition is Improved.  -----------------------------------------------------------------------------------------   Patient Active Problem List   Diagnosis Date Noted   Acute bilateral low back pain with right-sided sciatica 11/13/2019   History of partial hysterectomy 10/12/2019   Need for hepatitis C screening test 10/12/2019   Routine health maintenance 10/12/2019   Hypertension 10/12/2019   Hair loss 10/12/2019   Hematuria 10/12/2019   Endometriosis 08/05/2016   Postoperative state 07/21/2016   Past Medical History:  Diagnosis Date   Medical history non-contributory    No Known Allergies     Medications: Outpatient Medications Prior to Visit  Medication Sig   cyclobenzaprine (FLEXERIL) 10 MG tablet Take 1 tablet (10 mg total) by mouth at bedtime. Will cause drowsiness   diazepam (VALIUM) 2 MG tablet Take 1 tablet (2 mg total) by mouth every 8 (eight) hours as needed for anxiety.   hydrochlorothiazide (MICROZIDE) 12.5 MG capsule Take 1 capsule (12.5 mg total) by mouth daily.   meclizine (ANTIVERT) 25 MG tablet Take 1 tablet (25 mg total) by mouth 3 (three) times daily as needed for dizziness.   Multiple Vitamins-Minerals (MULTIVITAMIN GUMMIES WOMENS PO) Take 2 tablets by mouth at bedtime.   ondansetron (ZOFRAN ODT) 4 MG disintegrating tablet Take 1 tablet (4 mg total) by mouth every 8 (eight) hours as  needed for nausea or vomiting.   Vitamin D, Ergocalciferol, (DRISDOL) 1.25 MG (50000 UNIT) CAPS capsule Take 1 capsule (50,000 Units total) by mouth every 7 (seven) days.   No facility-administered medications prior to visit.    Review of Systems  Constitutional: Negative.   Respiratory:  Negative for cough, chest tightness and shortness of breath.   Cardiovascular:  Positive for chest pain. Negative for palpitations and leg swelling.       Report hx of heart murmur, per pt  Gastrointestinal:  Negative for abdominal pain.  Endocrine: Negative for cold intolerance and heat intolerance.  Neurological:  Negative for dizziness, weakness, light-headedness, numbness and headaches.  Psychiatric/Behavioral: Negative.        Objective    BP (!) 164/84   Pulse 74   Temp 97.9 F (36.6 C) (Oral)   Resp 16   Wt 120 lb 14.4 oz (54.8 kg)   LMP 07/09/2016 (Exact Date)   SpO2 100%   BMI 22.84 kg/m     Physical Exam Vitals and nursing note reviewed.  Constitutional:      General: She is not in acute distress.    Appearance: She is normal weight. She is not ill-appearing.  HENT:     Head: Normocephalic.  Cardiovascular:     Rate and Rhythm: Normal rate and regular rhythm.     Pulses: Normal pulses.          Carotid pulses are 2+ on the right side and 2+ on the left side.      Radial pulses are 2+ on the right side  and 2+ on the left side.       Femoral pulses are 2+ on the right side and 2+ on the left side.      Popliteal pulses are 2+ on the right side and 2+ on the left side.       Dorsalis pedis pulses are 2+ on the right side and 2+ on the left side.       Posterior tibial pulses are 2+ on the right side and 2+ on the left side.     Heart sounds: Normal heart sounds, S1 normal and S2 normal. No murmur heard.   No friction rub. No gallop.  Pulmonary:     Effort: Pulmonary effort is normal.     Breath sounds: Normal breath sounds.  Chest:       Comments: Described location of  CP Abdominal:     General: Bowel sounds are normal.     Palpations: Abdomen is soft.     Tenderness: There is no abdominal tenderness. There is no guarding.  Musculoskeletal:     Right lower leg: No edema.     Left lower leg: No edema.  Skin:    General: Skin is warm and dry.     Capillary Refill: Capillary refill takes 2 to 3 seconds.  Neurological:     General: No focal deficit present.     Mental Status: She is alert and oriented to person, place, and time.  Psychiatric:        Attention and Perception: Attention normal.        Mood and Affect: Mood normal.        Speech: Speech normal.        Behavior: Behavior normal.        Thought Content: Thought content normal.        Judgment: Judgment normal.      No results found for any visits on 08/16/20.  Assessment & Plan     Problem List Items Addressed This Visit       Cardiovascular and Mediastinum   Hypertension - Primary    Had been without medication d/t provided departure Re-established rx following emergency room visit  Increased of HCTZ from 12.5 to 25 mg, pt request change from capsule to tablet d/t GERD Plan for bp f/u and re-est care appt in 2 weeks following vacation       Relevant Medications   hydrochlorothiazide (HYDRODIURIL) 25 MG tablet     Digestive   Chest pain due to gastrointestinal reflux disease    Central epigastric pain, almost always present Rates 1-2/10 for pain Does not change with activity, food choice or stress Pt has reduced caffeine intake since previous ER visit Started on PPI, will eval in 2 wks for s/s improvement       Relevant Medications   hydrochlorothiazide (HYDRODIURIL) 25 MG tablet   pantoprazole (PROTONIX) 40 MG tablet   GERD without esophagitis    Generalized discomfort Upper gastric/lower esophagus region Does not re-produce upon palpation  Plan for PPI       Relevant Medications   pantoprazole (PROTONIX) 40 MG tablet    No follow-ups on file.     Vonna Kotyk, FNP, have reviewed all documentation for this visit. The documentation on 08/16/20 for the exam, diagnosis, procedures, and orders are all accurate and complete.    Gwyneth Sprout, [place] [phone] (phone) [phone] (fax)  [place]

## 2020-08-16 NOTE — Assessment & Plan Note (Signed)
Had been without medication d/t provided departure Re-established rx following emergency room visit  Increased of HCTZ from 12.5 to 25 mg, pt request change from capsule to tablet d/t GERD Plan for bp f/u and re-est care appt in 2 weeks following vacation

## 2020-08-16 NOTE — Assessment & Plan Note (Signed)
Generalized discomfort Upper gastric/lower esophagus region Does not re-produce upon palpation  Plan for PPI

## 2020-08-27 ENCOUNTER — Ambulatory Visit: Payer: Self-pay | Admitting: Family Medicine

## 2020-09-07 ENCOUNTER — Other Ambulatory Visit: Payer: Self-pay | Admitting: Family Medicine

## 2020-09-07 ENCOUNTER — Ambulatory Visit: Payer: Self-pay | Admitting: Family Medicine

## 2020-09-07 DIAGNOSIS — K219 Gastro-esophageal reflux disease without esophagitis: Secondary | ICD-10-CM

## 2020-09-21 ENCOUNTER — Other Ambulatory Visit: Payer: Self-pay | Admitting: Family Medicine

## 2020-09-21 DIAGNOSIS — K219 Gastro-esophageal reflux disease without esophagitis: Secondary | ICD-10-CM

## 2020-10-05 ENCOUNTER — Ambulatory Visit: Payer: Self-pay | Admitting: Family Medicine

## 2020-11-16 ENCOUNTER — Ambulatory Visit: Payer: Self-pay | Admitting: Family Medicine

## 2020-12-23 ENCOUNTER — Emergency Department
Admission: EM | Admit: 2020-12-23 | Discharge: 2020-12-23 | Disposition: A | Discharge status: Home / Self Care | Payer: Managed Care, Other (non HMO) | Attending: Emergency Medicine | Admitting: Emergency Medicine

## 2020-12-23 ENCOUNTER — Other Ambulatory Visit: Payer: Self-pay

## 2020-12-23 ENCOUNTER — Encounter: Payer: Self-pay | Admitting: Emergency Medicine

## 2020-12-23 DIAGNOSIS — I10 Essential (primary) hypertension: Secondary | ICD-10-CM | POA: Insufficient documentation

## 2020-12-23 DIAGNOSIS — H8112 Benign paroxysmal vertigo, left ear: Secondary | ICD-10-CM | POA: Insufficient documentation

## 2020-12-23 DIAGNOSIS — Z20822 Contact with and (suspected) exposure to covid-19: Secondary | ICD-10-CM | POA: Insufficient documentation

## 2020-12-23 DIAGNOSIS — R42 Dizziness and giddiness: Secondary | ICD-10-CM

## 2020-12-23 LAB — BASIC METABOLIC PANEL
Anion gap: 10 (ref 5–15)
BUN: 17 mg/dL (ref 6–20)
CO2: 27 mmol/L (ref 22–32)
Calcium: 10 mg/dL (ref 8.9–10.3)
Chloride: 98 mmol/L (ref 98–111)
Creatinine, Ser: 0.8 mg/dL (ref 0.44–1.00)
GFR, Estimated: >60 mL/min (ref >60)
Glucose, Bld: 150 mg/dL — ABNORMAL HIGH (ref 70–99)
Potassium: 3.5 mmol/L (ref 3.5–5.1)
Sodium: 135 mmol/L (ref 135–145)

## 2020-12-23 LAB — RESP PANEL BY RT-PCR (FLU A&B, COVID) ARPGX2
Influenza A by PCR: NEGATIVE
Influenza B by PCR: NEGATIVE
SARS Coronavirus 2 by RT PCR: NEGATIVE

## 2020-12-23 LAB — URINALYSIS, ROUTINE W REFLEX MICROSCOPIC
Bilirubin Urine: NEGATIVE
Glucose, UA: NEGATIVE mg/dL
Ketones, ur: 40 mg/dL — AB
Leukocytes,Ua: NEGATIVE
Nitrite: POSITIVE — AB
Protein, ur: 100 mg/dL — AB
Specific Gravity, Urine: >1.03 — ABNORMAL HIGH (ref 1.005–1.030)
pH: 6 (ref 5.0–8.0)

## 2020-12-23 LAB — CBC
HCT: 42.9 % (ref 36.0–46.0)
Hemoglobin: 15 g/dL (ref 12.0–15.0)
MCH: 30.4 pg (ref 26.0–34.0)
MCHC: 35 g/dL (ref 30.0–36.0)
MCV: 86.8 fL (ref 80.0–100.0)
Platelets: 272 10*3/uL (ref 150–400)
RBC: 4.94 MIL/uL (ref 3.87–5.11)
RDW: 12.2 % (ref 11.5–15.5)
WBC: 9.8 10*3/uL (ref 4.0–10.5)
nRBC: 0 % (ref 0.0–0.2)

## 2020-12-23 MED ORDER — MECLIZINE HCL 25 MG PO TABS
25.0000 mg | ORAL_TABLET | Freq: Once | ORAL | Status: AC
  Administered 2020-12-23: 14:00:00 25 mg via ORAL
  Filled 2020-12-23: qty 1

## 2020-12-23 MED ORDER — ONDANSETRON 4 MG PO TBDP
4.0000 mg | ORAL_TABLET | Freq: Once | ORAL | Status: AC
  Administered 2020-12-23: 13:00:00 4 mg via ORAL
  Filled 2020-12-23: qty 1

## 2020-12-23 MED ORDER — MECLIZINE HCL 25 MG PO TABS: 25.0000 mg | ORAL_TABLET | Freq: Three times a day (TID) | ORAL | 0 refills | Status: AC | PRN

## 2020-12-23 NOTE — ED Notes (Signed)
Attempted to call patient as they were not in the room for DC ppw review. Pt did not answer or return call.

## 2020-12-23 NOTE — ED Provider Notes (Signed)
[facility] Emergency Department Provider Note ____________________________________________   Event Date/Time   First MD Initiated Contact with Patient 12/23/20 1210     (approximate)  I have reviewed the triage vital signs and the nursing notes.  HISTORY  Chief Complaint Weakness, Dizziness, Nausea, and Chills   HPI Linda Frazier is a 50 y.o. femalewho presents to the ED for evaluation of weakness, N/V.   Chart review indicates a history of vertigo.  He was seen here in July, 5 months ago, for evaluation of vertigo.  Had an MRI brain at that time that was negative for CVA and discharged with meclizine.  She presents today, accompanied by her husband, for evaluation of vertigo for the past 24-36 hours.  She reports waking up yesterday morning with spinning sensation, vertigo, nausea and emesis but has been pretty consistent since yesterday.  She reports multiple episodes of nonbloody nonbilious emesis without abdominal pain or diarrhea.  Denies fever, headache, syncope or falls.  Physical feeling a little bit unsteady on her feet and worsening symptoms once bending her head to the left, but has had some degree of persistent symptoms since yesterday.  Past Medical History:  Diagnosis Date   Medical history non-contributory     Patient Active Problem List   Diagnosis Date Noted   Chest pain due to gastrointestinal reflux disease 08/16/2020   GERD without esophagitis 08/16/2020   Acute bilateral low back pain with right-sided sciatica 11/13/2019   History of partial hysterectomy 10/12/2019   Need for hepatitis C screening test 10/12/2019   Routine health maintenance 10/12/2019   Hypertension 10/12/2019   Hair loss 10/12/2019   Hematuria 10/12/2019   Endometriosis 08/05/2016   Postoperative state 07/21/2016    Past Surgical History:  Procedure Laterality Date   BILATERAL SALPINGECTOMY Bilateral 07/21/2016   Procedure: BILATERAL SALPINGECTOMY;  Surgeon:  Ouida Sills Gwen Her, MD;  Location: [facility] ORS;  Service: Gynecology;  Laterality: Bilateral;   NO PAST SURGERIES     VAGINAL HYSTERECTOMY N/A 07/21/2016   Procedure: HYSTERECTOMY VAGINAL;  Surgeon: Schermerhorn, Gwen Her, MD;  Location: [facility] ORS;  Service: Gynecology;  Laterality: N/A;    Prior to Admission medications   Medication Sig Start Date End Date Taking? Authorizing Provider  meclizine (ANTIVERT) 25 MG tablet Take 1 tablet (25 mg total) by mouth 3 (three) times daily as needed for dizziness. 12/23/20  Yes Vladimir Crofts, MD  cyclobenzaprine (FLEXERIL) 10 MG tablet Take 1 tablet (10 mg total) by mouth at bedtime. Will cause drowsiness 11/11/19   Flinchum, Kelby Aline, FNP  diazepam (VALIUM) 2 MG tablet Take 1 tablet (2 mg total) by mouth every 8 (eight) hours as needed for anxiety. 07/29/20 07/29/21  Nena Polio, MD  hydrochlorothiazide (HYDRODIURIL) 25 MG tablet Take 1 tablet (25 mg total) by mouth daily. 08/16/20   Gwyneth Sprout, FNP  hydrochlorothiazide (MICROZIDE) 12.5 MG capsule Take 1 capsule (12.5 mg total) by mouth daily. 08/06/20 09/05/20  Lucrezia Starch, MD  meclizine (ANTIVERT) 25 MG tablet Take 1 tablet (25 mg total) by mouth 3 (three) times daily as needed for dizziness. 07/29/20   Nena Polio, MD  Multiple Vitamins-Minerals (MULTIVITAMIN GUMMIES WOMENS PO) Take 2 tablets by mouth at bedtime.    [provider]  ondansetron (ZOFRAN ODT) 4 MG disintegrating tablet Take 1 tablet (4 mg total) by mouth every 8 (eight) hours as needed for nausea or vomiting. 07/29/20   Nena Polio, MD  pantoprazole (PROTONIX) 40 MG tablet  TAKE 1 TABLET BY MOUTH EVERY DAY 09/21/20   Tally Joe T, FNP  Vitamin D, Ergocalciferol, (DRISDOL) 1.25 MG (50000 UNIT) CAPS capsule Take 1 capsule (50,000 Units total) by mouth every 7 (seven) days. 10/20/19   Flinchum, Kelby Aline, FNP    Allergies Patient has no known allergies.  Family History  Problem Relation Age of Onset   Colon cancer  Maternal Grandmother    Hypertension Mother     Social History Social History   Tobacco Use   Smoking status: Never   Smokeless tobacco: Never  Vaping Use   Vaping Use: Never used  Substance Use Topics   Alcohol use: Yes   Drug use: No    Review of Systems  Constitutional: No fever/chills Eyes: No visual changes. ENT: No sore throat. Cardiovascular: Denies chest pain. Respiratory: Denies shortness of breath. Gastrointestinal: No abdominal pain. No diarrhea.  No constipation. Positive for nausea and vomiting Genitourinary: Negative for dysuria. Musculoskeletal: Negative for back pain. Skin: Negative for rash. Neurological: Negative for headaches, focal weakness or numbness. Positive for vertigo  ____________________________________________   PHYSICAL EXAM:  VITAL SIGNS: Vitals:   12/23/20 0933 12/23/20 1332  BP: 137/86 (!) 127/96  Pulse: 79 76  Resp: 20 16  Temp: 97.9 F (36.6 C)   SpO2: 100% 100%     Constitutional: Alert and oriented. Well appearing and in no acute distress. Eyes: Conjunctivae are normal. PERRL. EOMI. leftward horizontal nystagmus is noted. Head: Atraumatic. Nose: No congestion/rhinnorhea. Mouth/Throat: Mucous membranes are moist.  Oropharynx non-erythematous. Neck: No stridor. No cervical spine tenderness to palpation. Cardiovascular: Normal rate, regular rhythm. Grossly normal heart sounds.  Good peripheral circulation. Respiratory: Normal respiratory effort.  No retractions. Lungs CTAB. Gastrointestinal: Soft , nondistended, nontender to palpation. No CVA tenderness. Musculoskeletal: No lower extremity tenderness nor edema.  No joint effusions. No signs of acute trauma. Neurologic:  Normal speech and language. No gross focal neurologic deficits are appreciated. No gait instability noted. Cranial nerves II through XII intact 5/5 strength and sensation in all 4 extremities No dysmetria Skin:  Skin is warm, dry and intact. No rash  noted. Psychiatric: Mood and affect are normal. Speech and behavior are normal.  ____________________________________________   LABS (all labs ordered are listed, but only abnormal results are displayed)  Labs Reviewed  BASIC METABOLIC PANEL - Abnormal; Notable for the following components:      Result Value   Glucose, Bld 150 (*)    All other components within normal limits  URINALYSIS, ROUTINE W REFLEX MICROSCOPIC - Abnormal; Notable for the following components:   APPearance CLEAR (*)    Specific Gravity, Urine >1.030 (*)    Hgb urine dipstick TRACE (*)    Ketones, ur 40 (*)    Protein, ur 100 (*)    Nitrite POSITIVE (*)    Bacteria, UA RARE (*)    All other components within normal limits  RESP PANEL BY RT-PCR (FLU A&B, COVID) ARPGX2  CBC   ____________________________________________  12 Lead EKG  Sinus rhythm, rate of 72 bpm.  Normal axis and intervals.  Stigmata of LVH.  No evidence of acute ischemia. ____________________________________________  RADIOLOGY  ED MD interpretation:    Official radiology report(s): No results found.  ____________________________________________   PROCEDURES and INTERVENTIONS  Procedure(s) performed (including Critical Care):  Procedures  Medications  ondansetron (ZOFRAN-ODT) disintegrating tablet 4 mg (4 mg Oral Given 12/23/20 1243)  meclizine (ANTIVERT) tablet 25 mg (25 mg Oral Given 12/23/20 1354)    ____________________________________________  MDM / ED COURSE   Pleasant 50 year old female presents to the ED with vertigo, more consistent with peripheral vertigo such as BPPV, less likely CVA, ultimately amenable to outpatient management.  Looks clinically well, though remains symptomatic on her arrival to the ED.  Resolution of symptoms with meclizine and Zofran.  Does have some horizontal nystagmus, otherwise she is neurologically intact without evidence of acute deficits.  She just had an MRI a few months ago for  evaluation of similar symptoms.  She has low risk factors for acute CVA and resolution of symptoms with meclizine, no other features of acute stroke.  We discussed MRI at length, but with resolution of her symptoms shared decision-making yields plan to pursue outpatient management with ENT follow-up.  Return precautions discussed.  Clinical Course as of 12/23/20 1526  Sun Dec 23, 2020  1435 Reassessed.  Feeling a little bit better.  We discussed UA with signs of dehydration, but no UTI. [DS]  1523 Reassessed, feeling much better now.  Drink the whole cup of water without emesis.  She stands up and ambulates with a normal gait, asymptomatic.  We discussed management of vertigo at home, meclizine and following up with ENT.  We discussed return precautions with the ED. [DS]    Clinical Course User Index [DS] Vladimir Crofts, MD    ____________________________________________   FINAL CLINICAL IMPRESSION(S) / ED DIAGNOSES  Final diagnoses:  Vertigo  BPPV (benign paroxysmal positional vertigo), left     ED Discharge Orders          Ordered    meclizine (ANTIVERT) 25 MG tablet  3 times daily PRN        12/23/20 1525             Mikail Goostree   Note:  This document was prepared using Dragon voice recognition software and may include unintentional dictation errors.    Vladimir Crofts, MD 12/23/20 [phone]

## 2020-12-23 NOTE — ED Notes (Signed)
Urine collected and sent to lab.

## 2020-12-23 NOTE — ED Triage Notes (Signed)
Pt reports yesterday started with dizziness, weakness and NV. Pt reports has some chills as well but unsure of fever. Pt denies any exposures.

## 2021-01-02 ENCOUNTER — Other Ambulatory Visit: Payer: Self-pay

## 2021-01-02 ENCOUNTER — Emergency Department
Admission: EM | Admit: 2021-01-02 | Discharge: 2021-01-02 | Disposition: A | Discharge status: Home / Self Care | Payer: Self-pay | Attending: Emergency Medicine | Admitting: Emergency Medicine

## 2021-01-02 DIAGNOSIS — I10 Essential (primary) hypertension: Secondary | ICD-10-CM | POA: Insufficient documentation

## 2021-01-02 DIAGNOSIS — N3000 Acute cystitis without hematuria: Secondary | ICD-10-CM | POA: Insufficient documentation

## 2021-01-02 DIAGNOSIS — M545 Low back pain, unspecified: Secondary | ICD-10-CM | POA: Insufficient documentation

## 2021-01-02 LAB — URINALYSIS, ROUTINE W REFLEX MICROSCOPIC
Bilirubin Urine: NEGATIVE
Glucose, UA: NEGATIVE mg/dL
Leukocytes,Ua: NEGATIVE
Nitrite: NEGATIVE
Protein, ur: NEGATIVE mg/dL
Specific Gravity, Urine: 1.025 (ref 1.005–1.030)
pH: 7 (ref 5.0–8.0)

## 2021-01-02 LAB — PREGNANCY, URINE: Preg Test, Ur: NEGATIVE

## 2021-01-02 MED ORDER — CEPHALEXIN 500 MG PO CAPS: 500.0000 mg | ORAL_CAPSULE | Freq: Two times a day (BID) | ORAL | 0 refills | Status: AC

## 2021-01-02 NOTE — ED Notes (Signed)
Attempted to call the pt in the [place] for discharge, pt is no visualized and not responding, pt called on the phone with no answer.

## 2021-01-02 NOTE — Discharge Instructions (Signed)
Your urine looks better today than on the 4th, but we will still treat you with Keflex for 7 days.  Follow up with primary care in about 2 weeks to make sure the infection has completely cleared.   Return to the ER for symptoms that change or worsen if unable to schedule an appointment.

## 2021-01-02 NOTE — ED Provider Notes (Signed)
[facility] Emergency Department Provider Note ____________________________________________   Event Date/Time   First MD Initiated Contact with Patient 01/02/21 1438     (approximate)  I have reviewed the triage vital signs and the nursing notes.   HISTORY  Chief Complaint UTI  HPI Linda Frazier is a 50 y.o. female presents to the emergency department for treatment and evaluation of bilateral low back pain and dysuria. She was evaluated here 10 days ago and had nitrate positive urinalysis but was not placed on antibiotics. Dysuria and back pain not improving. No fever.         Past Medical History:  Diagnosis Date   Medical history non-contributory     Patient Active Problem List   Diagnosis Date Noted   Chest pain due to gastrointestinal reflux disease 08/16/2020   GERD without esophagitis 08/16/2020   Acute bilateral low back pain with right-sided sciatica 11/13/2019   History of partial hysterectomy 10/12/2019   Need for hepatitis C screening test 10/12/2019   Routine health maintenance 10/12/2019   Hypertension 10/12/2019   Hair loss 10/12/2019   Hematuria 10/12/2019   Endometriosis 08/05/2016   Postoperative state 07/21/2016    Past Surgical History:  Procedure Laterality Date   BILATERAL SALPINGECTOMY Bilateral 07/21/2016   Procedure: BILATERAL SALPINGECTOMY;  Surgeon: Ouida Sills Gwen Her, MD;  Location: [facility] ORS;  Service: Gynecology;  Laterality: Bilateral;   NO PAST SURGERIES     VAGINAL HYSTERECTOMY N/A 07/21/2016   Procedure: HYSTERECTOMY VAGINAL;  Surgeon: Schermerhorn, Gwen Her, MD;  Location: [facility] ORS;  Service: Gynecology;  Laterality: N/A;    Prior to Admission medications   Medication Sig Start Date End Date Taking? Authorizing Provider  cephALEXin (KEFLEX) 500 MG capsule Take 1 capsule (500 mg total) by mouth 2 (two) times daily for 7 days. 01/02/21 01/09/21 Yes Danyah Guastella B, FNP  cyclobenzaprine (FLEXERIL) 10 MG  tablet Take 1 tablet (10 mg total) by mouth at bedtime. Will cause drowsiness 11/11/19   Flinchum, Kelby Aline, FNP  diazepam (VALIUM) 2 MG tablet Take 1 tablet (2 mg total) by mouth every 8 (eight) hours as needed for anxiety. 07/29/20 07/29/21  Nena Polio, MD  hydrochlorothiazide (HYDRODIURIL) 25 MG tablet Take 1 tablet (25 mg total) by mouth daily. 08/16/20   Gwyneth Sprout, FNP  hydrochlorothiazide (MICROZIDE) 12.5 MG capsule Take 1 capsule (12.5 mg total) by mouth daily. 08/06/20 09/05/20  Lucrezia Starch, MD  meclizine (ANTIVERT) 25 MG tablet Take 1 tablet (25 mg total) by mouth 3 (three) times daily as needed for dizziness. 07/29/20   Nena Polio, MD  meclizine (ANTIVERT) 25 MG tablet Take 1 tablet (25 mg total) by mouth 3 (three) times daily as needed for dizziness. 12/23/20   Vladimir Crofts, MD  Multiple Vitamins-Minerals (MULTIVITAMIN GUMMIES WOMENS PO) Take 2 tablets by mouth at bedtime.    [provider]  ondansetron (ZOFRAN ODT) 4 MG disintegrating tablet Take 1 tablet (4 mg total) by mouth every 8 (eight) hours as needed for nausea or vomiting. 07/29/20   Nena Polio, MD  pantoprazole (PROTONIX) 40 MG tablet TAKE 1 TABLET BY MOUTH EVERY DAY 09/21/20   Tally Joe T, FNP  Vitamin D, Ergocalciferol, (DRISDOL) 1.25 MG (50000 UNIT) CAPS capsule Take 1 capsule (50,000 Units total) by mouth every 7 (seven) days. 10/20/19   Flinchum, Kelby Aline, FNP    Allergies Patient has no known allergies.  Family History  Problem Relation Age of Onset   Colon  cancer Maternal Grandmother    Hypertension Mother     Social History Social History   Tobacco Use   Smoking status: Never   Smokeless tobacco: Never  Vaping Use   Vaping Use: Never used  Substance Use Topics   Alcohol use: Yes   Drug use: No    Review of Systems  Constitutional: No fever/chills Eyes: No visual changes. ENT: No sore throat. Cardiovascular: Denies chest pain. Respiratory: Denies shortness of  breath. Gastrointestinal: No abdominal pain.  No nausea, no vomiting.  No diarrhea.  No constipation. Genitourinary: Positive for dysuria. Musculoskeletal: Negative for back pain. Skin: Negative for rash. Neurological: Negative for headaches, focal weakness or numbness  ____________________________________________   PHYSICAL EXAM:  VITAL SIGNS: ED Triage Vitals  Enc Vitals Group     BP 01/02/21 1438 (!) 158/83     Pulse Rate 01/02/21 1438 85     Resp 01/02/21 1438 18     Temp 01/02/21 1438 98 F (36.7 C)     Temp src --      SpO2 01/02/21 1438 96 %     Weight --      Height --      Head Circumference --      Peak Flow --      Pain Score 01/02/21 1438 4     Pain Loc --      Pain Edu? --      Excl. in [place]? --     Constitutional: Alert and oriented. Well appearing and in no acute distress. Eyes: Conjunctivae are normal. Head: Atraumatic. Nose: No congestion/rhinnorhea. Mouth/Throat: Mucous membranes are moist. Oropharynx non-erythematous. Neck: No stridor.   Hematological/Lymphatic/Immunilogical: No cervical lymphadenopathy. Cardiovascular: Normal rate, regular rhythm. Grossly normal heart sounds.  Good peripheral circulation. Respiratory: Normal respiratory effort.  No retractions. Lungs CTAB. Gastrointestinal: Soft and nontender. No distention. No abdominal bruits.  Musculoskeletal: No lower extremity tenderness nor edema.  No joint effusions. Neurologic:  Normal speech and language. No gross focal neurologic deficits are appreciated. No gait instability. Skin:  Skin is warm, dry and intact. No rash noted. Psychiatric: Mood and affect are normal. Speech and behavior are normal.  ____________________________________________   LABS (all labs ordered are listed, but only abnormal results are displayed)  Labs Reviewed  URINALYSIS, ROUTINE W REFLEX MICROSCOPIC - Abnormal; Notable for the following components:      Result Value   APPearance CLEAR (*)    Hgb urine  dipstick TRACE (*)    Ketones, ur TRACE (*)    Bacteria, UA FEW (*)    All other components within normal limits  PREGNANCY, URINE   ____________________________________________  EKG   ____________________________________________  RADIOLOGY  ED MD interpretation:     I, Sherrie George, personally viewed and evaluated these images (plain radiographs) as part of my medical decision making, as well as reviewing the written report by the radiologist.  Official radiology report(s): No results found.  ____________________________________________   PROCEDURES  Procedure(s) performed (including Critical Care):  Procedures  ____________________________________________   INITIAL IMPRESSION / ASSESSMENT AND PLAN     50 year old female presenting to the emergency department for treatment and evaluation of bilateral low back pain and dysuria. See HPI. Exam reassuring. Vitals are stable.  DIFFERENTIAL DIAGNOSIS  Pyelonephritis, acute cystitis  ED COURSE  Nitrate negative urine today. She does have trace bacteria. Will treat with Keflex since she is symptomatic. She was advised to follow up with primary care in about 2 weeks to make  sure it is completely clear.    ___________________________________________   FINAL CLINICAL IMPRESSION(S) / ED DIAGNOSES  Final diagnoses:  Acute cystitis without hematuria     ED Discharge Orders          Ordered    cephALEXin (KEFLEX) 500 MG capsule  2 times daily        01/02/21 1622             Linda Frazier was evaluated in Emergency Department on 01/02/2021 for the symptoms described in the history of present illness. She was evaluated in the context of the global COVID-19 pandemic, which necessitated consideration that the patient might be at risk for infection with the SARS-CoV-2 virus that causes COVID-19. Institutional protocols and algorithms that pertain to the evaluation of patients at risk for COVID-19 are in a state of  rapid change based on information released by regulatory bodies including the CDC and federal and state organizations. These policies and algorithms were followed during the patient's care in the ED.   Note:  This document was prepared using Dragon voice recognition software and may include unintentional dictation errors.    Victorino Dike, FNP 01/02/21 1830    Arta Silence, MD 01/04/21 1156

## 2021-01-02 NOTE — ED Triage Notes (Signed)
Pt comes with c/o urinary symptoms for over 10 days. Pt states she just really needs antibiotics for the infection.

## 2021-01-08 NOTE — ED Notes (Signed)
I called the pateint again today, but there is no answer and no voicemail.

## 2021-02-12 ENCOUNTER — Ambulatory Visit: Payer: Self-pay | Admitting: Family Medicine

## 2021-11-25 ENCOUNTER — Encounter: Payer: Self-pay | Admitting: Cardiology

## 2021-11-25 ENCOUNTER — Ambulatory Visit: Payer: BC Managed Care – PPO | Attending: Cardiology | Admitting: Cardiology

## 2021-11-25 VITALS — BP 130/76 | HR 75 | Ht 61.0 in | Wt 120.8 lb

## 2021-11-25 DIAGNOSIS — R072 Precordial pain: Secondary | ICD-10-CM

## 2021-11-25 DIAGNOSIS — R7303 Prediabetes: Secondary | ICD-10-CM

## 2021-11-25 DIAGNOSIS — Z131 Encounter for screening for diabetes mellitus: Secondary | ICD-10-CM

## 2021-11-25 DIAGNOSIS — Z01812 Encounter for preprocedural laboratory examination: Secondary | ICD-10-CM | POA: Diagnosis not present

## 2021-11-25 DIAGNOSIS — E785 Hyperlipidemia, unspecified: Secondary | ICD-10-CM

## 2021-11-25 MED ORDER — METOPROLOL TARTRATE 100 MG PO TABS: 100.0000 mg | ORAL_TABLET | Freq: Once | ORAL | 0 refills | Status: AC

## 2021-11-25 MED ORDER — ROSUVASTATIN CALCIUM 5 MG PO TABS: 5.0000 mg | ORAL_TABLET | Freq: Every day | ORAL | 3 refills | Status: AC

## 2021-11-25 NOTE — Patient Instructions (Addendum)
Medication Instructions:  Your physician has recommended you make the following change in your medication:  START: Crestor '5mg'$  daily.  *If you need a refill on your cardiac medications before your next appointment, please call your pharmacy*   Lab Work: Your physician recommends that you have the following labs drawn today: Hemoglobin A1c and BMET. Your Physician recommends that you return in 12 weeks to have the following lab drawn: Lipids If you have labs (blood work) drawn today and your tests are completely normal, you will receive your results only by: [place] (if you have MyChart) OR A paper copy in the mail If you have any lab test that is abnormal or we need to change your treatment, we will call you to review the results.   Testing/Procedures: Cardiac CT Angiography (CTA), is a special type of CT scan that uses a computer to produce multi-dimensional views of major blood vessels throughout the body. In CT angiography, a contrast material is injected through an IV to help visualize the blood vessels    Follow-Up: At [facility], you and your health needs are our priority.  As part of our continuing mission to provide you with exceptional heart care, we have created designated Provider Care Teams.  These Care Teams include your primary Cardiologist (physician) and Advanced Practice Providers (APPs -  Physician Assistants and Nurse Practitioners) who all work together to provide you with the care you need, when you need it.  We recommend signing up for the patient portal called "MyChart".  Sign up information is provided on this After Visit Summary.  MyChart is used to connect with patients for Virtual Visits (Telemedicine).  Patients are able to view lab/test results, encounter notes, upcoming appointments, etc.  Non-urgent messages can be sent to your provider as well.   To learn more about what you can do with MyChart, go to [email or web address].    Your next  appointment:   16 week(s)  The format for your next appointment:   Virtual Visit   Provider:   Berniece Salines, DO     Other Instructions   Your cardiac CT will be scheduled at one of the below locations:   [facility] [address] [place], [place] [address] [phone]  If scheduled at [facility], please arrive at the [facility] and Children's Entrance (Entrance C2) of [facility] 30 minutes prior to test start time. You can use the FREE valet parking offered at entrance C (encouraged to control the heart rate for the test)  Proceed to the [facility] Radiology Department (first floor) to check-in and test prep.  All radiology patients and guests should use entrance C2 at [facility], accessed from [facility], even though the hospital's physical address listed is [address]     Please follow these instructions carefully (unless otherwise directed):  On the Night Before the Test: Be sure to Drink plenty of water. Do not consume any caffeinated/decaffeinated beverages or chocolate 12 hours prior to your test. Do not take any antihistamines 12 hours prior to your test.  On the Day of the Test: Drink plenty of water until 1 hour prior to the test. Do not eat any food 1 hour prior to test. You may take your regular medications prior to the test.  Take metoprolol (Lopressor) '100mg'$   two hours prior to test. HOLD Hydrochlorothiazide morning of the test. FEMALES- please wear underwire-free bra if available, avoid dresses & tight clothing  After the Test: Drink plenty of water. After receiving IV contrast, you may experience a mild flushed feeling. This is normal. On occasion, you may experience a mild rash up to 24 hours after the test. This is not dangerous. If this occurs, you can take Benadryl 25 mg and increase your fluid intake. If you experience trouble breathing, this can be serious. If it is severe call 911  IMMEDIATELY. If it is mild, please call our office. If you take any of these medications: Glipizide/Metformin, Avandament, Glucavance, please do not take 48 hours after completing test unless otherwise instructed.  We will call to schedule your test 2-4 weeks out understanding that some insurance companies will need an authorization prior to the service being performed.   For non-scheduling related questions, please contact the cardiac imaging nurse navigator should you have any questions/concerns: Marchia Bond, Cardiac Imaging Nurse Navigator Gordy Clement, Cardiac Imaging Nurse Navigator [facility] Direct Office Dial: [phone]   For scheduling needs, including cancellations and rescheduling, please call [place], [phone].

## 2021-11-25 NOTE — Progress Notes (Signed)
Cardiology Office Note:    Date:  11/25/2021   ID:  Linda Frazier, DOB 1970-12-31, MRN [id number]  PCP:  Gwyneth Sprout, FNP  Cardiologist:  Berniece Salines, DO  Electrophysiologist:  None   Referring MD: Ardine Eng, MD   Chief Complaint  Patient presents with   New Patient (Initial Visit)   History of Present Illness:    Linda Frazier is a 51 y.o. female with a hx of hypertension on hydrochlorothiazide, GERD, and Hyperlipidemia who presents to be evaluated for chest discomfort.  She does tell me that she had periods where she was experiencing midsternal intermittent chest discomfort.  Off-and-on nothing made better or worse.  She started to take Prilosec which helped for a little bit.  But this has not really completely taken away the pain.  No shortness of breath no lightheadedness or dizziness.   Past Medical History:  Diagnosis Date   Medical history non-contributory     Past Surgical History:  Procedure Laterality Date   BILATERAL SALPINGECTOMY Bilateral 07/21/2016   Procedure: BILATERAL SALPINGECTOMY;  Surgeon: Schermerhorn, Gwen Her, MD;  Location: [facility] ORS;  Service: Gynecology;  Laterality: Bilateral;   NO PAST SURGERIES     VAGINAL HYSTERECTOMY N/A 07/21/2016   Procedure: HYSTERECTOMY VAGINAL;  Surgeon: Schermerhorn, Gwen Her, MD;  Location: [facility] ORS;  Service: Gynecology;  Laterality: N/A;    Current Medications: Current Meds  Medication Sig   hydrochlorothiazide (HYDRODIURIL) 25 MG tablet Take 1 tablet (25 mg total) by mouth daily.   meclizine (ANTIVERT) 25 MG tablet Take 1 tablet (25 mg total) by mouth 3 (three) times daily as needed for dizziness.   metoprolol tartrate (LOPRESSOR) 100 MG tablet Take 1 tablet (100 mg total) by mouth once for 1 dose.   Multiple Vitamins-Minerals (MULTIVITAMIN GUMMIES WOMENS PO) Take 2 tablets by mouth at bedtime.   omeprazole (PRILOSEC) 20 MG capsule Take 1 capsule by mouth daily.   rosuvastatin (CRESTOR) 5 MG tablet Take 1 tablet (5 mg  total) by mouth daily.     Allergies:   Patient has no known allergies.   Social History   Socioeconomic History   Marital status: Married    Spouse name: Not on file   Number of children: Not on file   Years of education: Not on file   Highest education level: Not on file  Occupational History   Not on file  Tobacco Use   Smoking status: Never   Smokeless tobacco: Never  Vaping Use   Vaping Use: Never used  Substance and Sexual Activity   Alcohol use: Yes   Drug use: No   Sexual activity: Not on file  Other Topics Concern   Not on file  Social History Narrative   Not on file   Social Determinants of Health   Financial Resource Strain: Not on file  Food Insecurity: Not on file  Transportation Needs: Not on file  Physical Activity: Not on file  Stress: Not on file  Social Connections: Not on file     Family History: The patient's family history includes Colon cancer in her maternal grandmother; Hypertension in her mother.  ROS:   Review of Systems  Constitution: Negative for decreased appetite, fever and weight gain.  HENT: Negative for congestion, ear discharge, hoarse voice and sore throat.   Eyes: Negative for discharge, redness, vision loss in right eye and visual halos.  Cardiovascular: Negative for chest pain, dyspnea on exertion, leg swelling, orthopnea and palpitations.  Respiratory: Negative for  cough, hemoptysis, shortness of breath and snoring.   Endocrine: Negative for heat intolerance and polyphagia.  Hematologic/Lymphatic: Negative for bleeding problem. Does not bruise/bleed easily.  Skin: Negative for flushing, nail changes, rash and suspicious lesions.  Musculoskeletal: Negative for arthritis, joint pain, muscle cramps, myalgias, neck pain and stiffness.  Gastrointestinal: Negative for abdominal pain, bowel incontinence, diarrhea and excessive appetite.  Genitourinary: Negative for decreased libido, genital sores and incomplete emptying.   Neurological: Negative for brief paralysis, focal weakness, headaches and loss of balance.  Psychiatric/Behavioral: Negative for altered mental status, depression and suicidal ideas.  Allergic/Immunologic: Negative for HIV exposure and persistent infections.    EKGs/Labs/Other Studies Reviewed:    The following studies were reviewed today:   EKG:  The ekg ordered today demonstrates sinus rhythm, heart rate 75 bpm with nonspecific ST changes.  Recent Labs: 12/23/2020: BUN 17; Creatinine, Ser 0.80; Hemoglobin 15.0; Platelets 272; Potassium 3.5; Sodium 135  Recent Lipid Panel    Component Value Date/Time   CHOL 269 (H) 10/12/2019 1102   TRIG 83 10/12/2019 1102   HDL 78 10/12/2019 1102   CHOLHDL 3.4 10/12/2019 1102   LDLCALC 177 (H) 10/12/2019 1102    Physical Exam:    VS:  BP 130/76 (BP Location: Left Arm, Patient Position: Sitting, Cuff Size: Normal)   Pulse 75   Ht '5\' 1"'$  (1.549 m)   Wt 120 lb 12.8 oz (54.8 kg)   LMP 07/09/2016 (Exact Date)   SpO2 99%   BMI 22.82 kg/m     Wt Readings from Last 3 Encounters:  11/25/21 120 lb 12.8 oz (54.8 kg)  12/23/20 120 lb (54.4 kg)  08/16/20 120 lb 14.4 oz (54.8 kg)     GEN: Well nourished, well developed in no acute distress HEENT: Normal NECK: No JVD; No carotid bruits LYMPHATICS: No lymphadenopathy CARDIAC: S1S2 noted,RRR, no murmurs, rubs, gallops RESPIRATORY:  Clear to auscultation without rales, wheezing or rhonchi  ABDOMEN: Soft, non-tender, non-distended, +bowel sounds, no guarding. EXTREMITIES: No edema, No cyanosis, no clubbing MUSCULOSKELETAL:  No deformity  SKIN: Warm and dry NEUROLOGIC:  Alert and oriented x 3, non-focal PSYCHIATRIC:  Normal affect, good insight  ASSESSMENT:    1. Precordial pain   2. Hyperlipidemia, unspecified hyperlipidemia type   3. Prediabetes   4. Encounter for preprocedural laboratory examination   5. Screening for diabetes mellitus    PLAN:    The symptoms chest pain is  concerning, this patient does have intermediate risk for coronary artery disease and at this time I would like to pursue an ischemic evaluation in this patient.  Shared decision a coronary CTA at this time is appropriate.  I have discussed with the patient about the testing.  The patient has no IV contrast allergy and is agreeable to proceed with this test.  Her blood pressure is acceptable.  I reviewed her lipid profile which was done by her PCP on October 24 showing total cholesterol 238, HDL 71, LDL 148, non-HDL 167, while this is an improvement from her blood work in 2021 the patient will benefit from low-dose statin was started on Crestor 5 mg daily. We will also get hemoglobin A1c for diabetes screening.  The patient is in agreement with the above plan. The patient left the office in stable condition.  The patient will follow up in   Medication Adjustments/Labs and Tests Ordered: Current medicines are reviewed at length with the patient today.  Concerns regarding medicines are outlined above.  Orders Placed This Encounter  Procedures   CT CORONARY MORPH W/CTA COR W/SCORE W/CA W/CM &/OR WO/CM   Lipid panel   Basic Metabolic Panel (BMET)   Hemoglobin A1c   EKG 12-Lead   Meds ordered this encounter  Medications   rosuvastatin (CRESTOR) 5 MG tablet    Sig: Take 1 tablet (5 mg total) by mouth daily.    Dispense:  90 tablet    Refill:  3   metoprolol tartrate (LOPRESSOR) 100 MG tablet    Sig: Take 1 tablet (100 mg total) by mouth once for 1 dose.    Dispense:  1 tablet    Refill:  0    Patient Instructions  Medication Instructions:  Your physician has recommended you make the following change in your medication:  START: Crestor '5mg'$  daily.  *If you need a refill on your cardiac medications before your next appointment, please call your pharmacy*   Lab Work: Your physician recommends that you have the following labs drawn today: Hemoglobin A1c and BMET. Your Physician recommends  that you return in 12 weeks to have the following lab drawn: Lipids If you have labs (blood work) drawn today and your tests are completely normal, you will receive your results only by: [place] (if you have MyChart) OR A paper copy in the mail If you have any lab test that is abnormal or we need to change your treatment, we will call you to review the results.   Testing/Procedures: Cardiac CT Angiography (CTA), is a special type of CT scan that uses a computer to produce multi-dimensional views of major blood vessels throughout the body. In CT angiography, a contrast material is injected through an IV to help visualize the blood vessels    Follow-Up: At [facility], you and your health needs are our priority.  As part of our continuing mission to provide you with exceptional heart care, we have created designated Provider Care Teams.  These Care Teams include your primary Cardiologist (physician) and Advanced Practice Providers (APPs -  Physician Assistants and Nurse Practitioners) who all work together to provide you with the care you need, when you need it.  We recommend signing up for the patient portal called "MyChart".  Sign up information is provided on this After Visit Summary.  MyChart is used to connect with patients for Virtual Visits (Telemedicine).  Patients are able to view lab/test results, encounter notes, upcoming appointments, etc.  Non-urgent messages can be sent to your provider as well.   To learn more about what you can do with MyChart, go to [email or web address].    Your next appointment:   16 week(s)  The format for your next appointment:   Virtual Visit   Provider:   Berniece Salines, DO     Other Instructions   Your cardiac CT will be scheduled at one of the below locations:   [facility] [address] [place], [place] [address] [phone]  If scheduled at [facility], please arrive at the [facility]trance (Entrance C2) of [facility] 30 minutes prior to test start time. You can use the FREE valet parking offered at entrance C (encouraged to control the heart rate for the test)  Proceed to the [facility] Radiology Department (first floor) to check-in and test prep.  All radiology patients and guests should use entrance C2 at [facility], accessed from [facility], even though the hospital's physical address listed is [address].  Please follow these instructions carefully (unless otherwise directed):  On the Night Before the Test: Be sure to Drink plenty of water. Do not consume any caffeinated/decaffeinated beverages or chocolate 12 hours prior to your test. Do not take any antihistamines 12 hours prior to your test.  On the Day of the Test: Drink plenty of water until 1 hour prior to the test. Do not eat any food 1 hour prior to test. You may take your regular medications prior to the test.  Take metoprolol (Lopressor) '100mg'$   two hours prior to test. HOLD Hydrochlorothiazide morning of the test. FEMALES- please wear underwire-free bra if available, avoid dresses & tight clothing      After the Test: Drink plenty of water. After receiving IV contrast, you may experience a mild flushed feeling. This is normal. On occasion, you may experience a mild rash up to 24 hours after the test. This is not dangerous. If this occurs, you can take Benadryl 25 mg and increase your fluid intake. If you experience trouble breathing, this can be serious. If it is severe call 911 IMMEDIATELY. If it is mild, please call our office. If you take any of these medications: Glipizide/Metformin, Avandament, Glucavance, please do not take 48 hours after completing test unless otherwise instructed.  We will call to schedule your test 2-4 weeks out understanding that some insurance companies will need an authorization prior to the service being performed.   For  non-scheduling related questions, please contact the cardiac imaging nurse navigator should you have any questions/concerns: Marchia Bond, Cardiac Imaging Nurse Navigator Gordy Clement, Cardiac Imaging Nurse Navigator [facility] Direct Office Dial: [phone]   For scheduling needs, including cancellations and rescheduling, please call [place], [phone].    Adopting a Healthy Lifestyle.  Know what a healthy weight is for you (roughly BMI <25) and aim to maintain this   Aim for 7+ servings of fruits and vegetables daily   65-80+ fluid ounces of water or unsweet tea for healthy kidneys   Limit to max 1 drink of alcohol per day; avoid smoking/tobacco   Limit animal fats in diet for cholesterol and heart health - choose grass fed whenever available   Avoid highly processed foods, and foods high in saturated/trans fats   Aim for low stress - take time to unwind and care for your mental health   Aim for 150 min of moderate intensity exercise weekly for heart health, and weights twice weekly for bone health   Aim for 7-9 hours of sleep daily   When it comes to diets, agreement about the perfect plan isnt easy to find, even among the experts. Experts at the [employer] developed an idea known as the Healthy Eating Plate. Just imagine a plate divided into logical, healthy portions.   The emphasis is on diet quality:   Load up on vegetables and fruits - one-half of your plate: Aim for color and variety, and remember that potatoes dont count.   Go for whole grains - one-quarter of your plate: Whole wheat, barley, wheat berries, quinoa, oats, brown rice, and foods made with them. If you want pasta, go with whole wheat pasta.   Protein power - one-quarter of your plate: Fish, chicken, beans, and nuts are all healthy, versatile protein sources. Limit red meat.   The diet, however, does go beyond the plate, offering a few other  suggestions.   Use healthy plant oils, such as olive, canola, soy, corn,  sunflower and peanut. Check the labels, and avoid partially hydrogenated oil, which have unhealthy trans fats.   If youre thirsty, drink water. Coffee and tea are good in moderation, but skip sugary drinks and limit milk and dairy products to one or two daily servings.   The type of carbohydrate in the diet is more important than the amount. Some sources of carbohydrates, such as vegetables, fruits, whole grains, and beans-are healthier than others.   Finally, stay active  Signed, Berniece Salines, DO  11/25/2021 4:45 PM    [facility]

## 2021-11-26 LAB — BASIC METABOLIC PANEL
BUN/Creatinine Ratio: 14 (ref 9–23)
BUN: 13 mg/dL (ref 6–24)
CO2: 22 mmol/L (ref 20–29)
Calcium: 10.2 mg/dL (ref 8.7–10.2)
Chloride: 101 mmol/L (ref 96–106)
Creatinine, Ser: 0.9 mg/dL (ref 0.57–1.00)
Glucose: 120 mg/dL — ABNORMAL HIGH (ref 70–99)
Potassium: 3.6 mmol/L (ref 3.5–5.2)
Sodium: 141 mmol/L (ref 134–144)
eGFR: 78 mL/min/{1.73_m2} (ref >59)

## 2021-11-26 LAB — HEMOGLOBIN A1C
Est. average glucose Bld gHb Est-mCnc: 131 mg/dL
Hgb A1c MFr Bld: 6.2 % — ABNORMAL HIGH (ref 4.8–5.6)

## 2021-12-09 ENCOUNTER — Telehealth (HOSPITAL_COMMUNITY): Payer: Self-pay | Admitting: Emergency Medicine

## 2021-12-09 NOTE — Telephone Encounter (Signed)
Reaching out to patient to offer assistance regarding upcoming cardiac imaging study; pt verbalizes understanding of appt date/time, parking situation and where to check in, pre-test NPO status and medications ordered, and verified current allergies; name and call back number provided for further questions should they arise Marchia Bond RN Navigator Cardiac Imaging [facility] [phone] office [phone] cell  Difficult IV  Arrival 800 '100mg'$  metoprolol tartrate

## 2021-12-11 ENCOUNTER — Ambulatory Visit: Payer: Self-pay | Admitting: Cardiology

## 2021-12-11 ENCOUNTER — Ambulatory Visit (HOSPITAL_COMMUNITY)
Admission: RE | Admit: 2021-12-11 | Discharge: 2021-12-11 | Disposition: A | Discharge status: Home / Self Care | Payer: BC Managed Care – PPO | Source: Ambulatory Visit | Attending: Cardiology | Admitting: Cardiology

## 2021-12-11 DIAGNOSIS — R072 Precordial pain: Secondary | ICD-10-CM | POA: Diagnosis not present

## 2021-12-11 MED ORDER — NITROGLYCERIN 0.4 MG SL SUBL
0.8000 mg | SUBLINGUAL_TABLET | Freq: Once | SUBLINGUAL | Status: AC
  Administered 2021-12-11: 0.8 mg via SUBLINGUAL

## 2021-12-11 MED ORDER — IOHEXOL 350 MG/ML SOLN
100.0000 mL | Freq: Once | INTRAVENOUS | Status: AC | PRN
  Administered 2021-12-11: 100 mL via INTRAVENOUS

## 2021-12-11 MED ORDER — NITROGLYCERIN 0.4 MG SL SUBL
SUBLINGUAL_TABLET | SUBLINGUAL | Status: AC
  Filled 2021-12-11: qty 2

## 2022-02-11 ENCOUNTER — Telehealth: Payer: Self-pay

## 2022-02-11 NOTE — Telephone Encounter (Signed)
Called pt to come in later on Jan 30th or reschedule. No answer at this time. Left message for pt to return the call.

## 2022-02-18 ENCOUNTER — Ambulatory Visit: Payer: BC Managed Care – PPO | Admitting: Cardiology

## 2022-04-14 ENCOUNTER — Ambulatory Visit: Payer: BC Managed Care – PPO | Admitting: Cardiology

## 2022-04-18 ENCOUNTER — Ambulatory Visit: Payer: BC Managed Care – PPO | Admitting: Cardiology
